# Patient Record
Sex: Male | Born: 1985 | Race: White | Hispanic: No | Marital: Married | Smoking: Never smoker
Health system: Southern US, Community
[De-identification: ages and names within clinical notes are randomized; demographics above are authoritative.]

## PROBLEM LIST (undated history)

## (undated) HISTORY — PX: MECHANICAL AORTIC VALVE REPLACEMENT: SHX2013

---

## 2021-06-18 ENCOUNTER — Encounter: Payer: Self-pay | Admitting: Emergency Medicine

## 2021-06-18 ENCOUNTER — Other Ambulatory Visit: Payer: Self-pay

## 2021-06-18 ENCOUNTER — Emergency Department: Payer: No Typology Code available for payment source

## 2021-06-18 ENCOUNTER — Emergency Department
Admission: EM | Admit: 2021-06-18 | Discharge: 2021-06-18 | Disposition: A | Discharge status: Home / Self Care | Payer: No Typology Code available for payment source | Attending: Emergency Medicine | Admitting: Emergency Medicine

## 2021-06-18 DIAGNOSIS — W500XXA Accidental hit or strike by another person, initial encounter: Secondary | ICD-10-CM | POA: Diagnosis not present

## 2021-06-18 DIAGNOSIS — S0285XA Fracture of orbit, unspecified, initial encounter for closed fracture: Secondary | ICD-10-CM

## 2021-06-18 DIAGNOSIS — S0232XA Fracture of orbital floor, left side, initial encounter for closed fracture: Secondary | ICD-10-CM | POA: Diagnosis not present

## 2021-06-18 DIAGNOSIS — S0592XA Unspecified injury of left eye and orbit, initial encounter: Secondary | ICD-10-CM | POA: Diagnosis present

## 2021-06-18 DIAGNOSIS — Y9371 Activity, boxing: Secondary | ICD-10-CM | POA: Insufficient documentation

## 2021-06-18 MED ORDER — HYDROCODONE-ACETAMINOPHEN 5-325 MG PO TABS: 1.0000 | ORAL_TABLET | ORAL | 0 refills | Status: AC | PRN

## 2021-06-18 MED ORDER — FLUTICASONE PROPIONATE 50 MCG/ACT NA SUSP: 1.0000 | Freq: Two times a day (BID) | NASAL | 0 refills | Status: AC

## 2021-06-18 MED ORDER — BUTALBITAL-APAP-CAFFEINE 50-325-40 MG PO TABS
1.0000 | ORAL_TABLET | Freq: Once | ORAL | Status: DC
Start: 2021-06-18 — End: 2021-06-18
  Filled 2021-06-18: qty 1

## 2021-06-18 MED ORDER — CETIRIZINE HCL 10 MG PO TABS: 10.0000 mg | ORAL_TABLET | Freq: Every day | ORAL | 0 refills | Status: AC

## 2021-06-18 NOTE — ED Triage Notes (Addendum)
First Nurse Note; Pt via POV from home. Pt c/o L eye swelling and pain. States that he was boxing and got hit in the eye PTA. L eye swollen shut at this time. Pt is able to open it but states it is painful and certain eye movements are painful. Pt is A&Ox4 and NAD

## 2021-06-18 NOTE — ED Provider Notes (Signed)
[facility] Provider Note  Patient Contact: 3:21 PM (approximate)   History   Eye Injury   HPI  Keith Roy is a 36 y.o. male who presents the emergency department for evaluation of facial injury.  Patient states that he was boxing earlier today, he was struck in the left eye.  He states that initially he had a little bit of blurred vision but that was clearing up.  No loss of vision, curtain vision was described.  He states that after he was done sparring, he was in his truck, went to blow his nose and as he blew his nose he felt a pop and immediately the left side of his face started to swell.  He did have a little bit of congealed blood come out of his nose when he blew his nose but he has not had any frank epistaxis.  Patient states that his major concern is the amount of edema that developed after blowing his nose.  No periorbital or facial edema prior to blowing his nose.  Patient states that he was able to push on it and make some of the edema go down then had to blow his nose again and it "swelled back up."  Patient denies any headache, neck pain.  No other injury or complaint.  Does not wear glasses or contacts.     Physical Exam   Triage Vital Signs: ED Triage Vitals [06/18/21 1514]  Enc Vitals Group     BP (!) 135/93     Pulse Rate 78     Resp 17     Temp 97.7 F (36.5 C)     Temp Source Oral     SpO2 98 %     Weight 225 lb (102.1 kg)     Height 5' 9.5" (1.765 m)     Head Circumference      Peak Flow      Pain Score 8     Pain Loc      Pain Edu?      Excl. in [place]?     Most recent vital signs: Vitals:   06/18/21 1514 06/18/21 1738  BP: (!) 135/93 129/81  Pulse: 78 81  Resp: 17 14  Temp: 97.7 F (36.5 C) 98 F (36.7 C)  SpO2: 98% 100%     General: Alert and in no acute distress. Eyes:  PERRL. EOMI. large amount of periorbital edema to the left side.  This appears traumatic in nature no evidence of cellulitic changes.  Eyelids are  opened manually and funduscopic exam is performed.  No areas of retained foreign body identified, pupil responds appropriately to light.  Patient has no evidence of hyphema or retinal detachment on funduscopic exam. Head: Left-sided peri and facial edema.  Palpation reveals findings consistent with a  rice crispy sensation inferior orbital region.  No open wounds.  No cellulitic changes.  Patient is tender and along the inferior left orbit extending towards the maxillary sinuses and left nasal bone. What appears to be subcutaneous emphysema, no palpable abnormality.  As well signs of trauma to the skull.  No battle signs, raccoon eyes, serosanguineous fluid drainage from the ears or nares. ENT:      Ears:       Nose: No congestion/rhinnorhea.  No evidence of epistaxis      Mouth/Throat: Mucous membranes are moist. Neck: No stridor. No cervical spine tenderness to palpation.  Cardiovascular:  Good peripheral perfusion Respiratory: Normal respiratory effort without tachypnea  or retractions. Lungs CTAB. Musculoskeletal: Full range of motion to all extremities.  Neurologic:  No gross focal neurologic deficits are appreciated.  Cranial nerves grossly intact Skin:   No rash noted Other:   ED Results / Procedures / Treatments   Labs (all labs ordered are listed, but only abnormal results are displayed) Labs Reviewed - No data to display   EKG     RADIOLOGY  I personally viewed, evaluated, and interpreted these images as part of my medical decision making, as well as reviewing the written report by the radiologist.  ED Provider Interpretation: Patient with findings consistent with a blowout fracture to the left orbit with significant amount of subcutaneous emphysema.  Trace amount of blood identified in the sinuses and periorbital region.  CT Head Wo Contrast  Result Date: 06/18/2021 CLINICAL DATA:  Trauma EXAM: CT HEAD WITHOUT CONTRAST TECHNIQUE: Contiguous axial images were obtained  from the base of the skull through the vertex without intravenous contrast. RADIATION DOSE REDUCTION: This exam was performed according to the departmental dose-optimization program which includes automated exposure control, adjustment of the mA and/or kV according to patient size and/or use of iterative reconstruction technique. COMPARISON:  None Available. FINDINGS: Brain: No acute intracranial findings are seen in noncontrast CT brain. There are no signs of bleeding. Ventricles are not dilated. Vascular: Unremarkable. Skull: No fracture is seen in the calvarium. Sinuses/Orbits: Small air-fluid level is seen in the left maxillary sinus. There is mucosal thickening in the ethmoid, sphenoid and maxillary sinuses. There is possible blowout fracture in the floor of left orbit. Orbits will be better evaluated in the images of the facial bones. Other: None. IMPRESSION: No acute intracranial findings are seen in noncontrast CT brain. Chronic sinusitis. Blowout fracture is seen in the floor of left orbit. Electronically Signed   By: Elmer Picker M.D.   On: 06/18/2021 16:07   CT Cervical Spine Wo Contrast  Result Date: 06/18/2021 CLINICAL DATA:  Trauma EXAM: CT CERVICAL SPINE WITHOUT CONTRAST TECHNIQUE: Multidetector CT imaging of the cervical spine was performed without intravenous contrast. Multiplanar CT image reconstructions were also generated. RADIATION DOSE REDUCTION: This exam was performed according to the departmental dose-optimization program which includes automated exposure control, adjustment of the mA and/or kV according to patient size and/or use of iterative reconstruction technique. COMPARISON:  None Available. FINDINGS: Alignment: Alignment of posterior margins of vertebral bodies is unremarkable. Skull base and vertebrae: No recent fracture is seen. Degenerative changes are noted with bony spurs from C3 to C7 levels. There is calcification in the posterior margin of disc space at C2-C3 level.  Soft tissues and spinal canal: There is extrinsic pressure over the ventral margin of thecal sac caused by posterior bony spurs at C5-C6 and C6-C7 levels. Disc levels: There is encroachment of neural foramina from C3-C7 levels. Upper chest: Unremarkable. Other: None. IMPRESSION: No recent fracture is seen in the cervical spine. Cervical spondylosis with encroachment of neural foramina from C3-C7 levels. Electronically Signed   By: Elmer Picker M.D.   On: 06/18/2021 16:19   CT Maxillofacial Wo Contrast  Result Date: 06/18/2021 CLINICAL DATA:  Trauma EXAM: CT MAXILLOFACIAL WITHOUT CONTRAST TECHNIQUE: Multidetector CT imaging of the maxillofacial structures was performed. Multiplanar CT image reconstructions were also generated. RADIATION DOSE REDUCTION: This exam was performed according to the departmental dose-optimization program which includes automated exposure control, adjustment of the mA and/or kV according to patient size and/or use of iterative reconstruction technique. COMPARISON:  None Available. FINDINGS: Osseous: There  is comminuted blowout fracture in the floor of left orbit. There is a proximally 7 mm depression of fracture fragments. There is acute right angulation in the base of bony portion of nasal septum which may be due to recent or remote injury. Orbits: There is no definite herniation of ocular muscles through the blowout fracture in the floor of the left orbit. Optic globes are symmetrical. There are pockets of air around the optic globe. Retrobulbar soft tissues are essentially unremarkable. Sinuses: There is mucosal thickening and mucous retention cysts in the ethmoid sphenoid and maxillary sinuses. There is minimal amount of fluid in the dependent portion of left maxillary sinus. There are pockets of air in the left periorbital region and left side of face. Soft tissues: There is left periorbital soft tissue swelling and loculated pockets of air related to blowout fracture in the  floor of the left orbit. Limited intracranial: Unremarkable. IMPRESSION: There is comminuted, depressed fracture in the floor of the left orbit. There is no demonstrable herniation of extraocular muscles. Are multiple pockets of air around the optic globe and left periorbital region related to the blowout fracture of the floor of the left orbit. Optic globes are symmetrical. Retrobulbar soft tissues are unremarkable. Chronic sinusitis. Small air-fluid level in the dependent portion of left maxillary sinus may be related to blowout fracture of the floor of the left orbit. Electronically Signed   By: Elmer Picker M.D.   On: 06/18/2021 16:16    PROCEDURES:  Critical Care performed: No  Procedures   MEDICATIONS ORDERED IN ED: Medications  butalbital-acetaminophen-caffeine (FIORICET) 50-325-40 MG per tablet 1 tablet (1 tablet Oral Given 06/18/21 1738)     IMPRESSION / MDM / ASSESSMENT AND PLAN / ED COURSE  I reviewed the triage vital signs and the nursing notes.                              Differential diagnosis includes, but is not limited to, hematoma of the left eye, globe rupture, hyphema, facial fracture, orbital fracture, orbital nerve entrapment, subcutaneous emphysema.   Patient's diagnosis is consistent with orbital blowout fracture with subcutaneous emphysema.  Patient presents to the ED after being hit in the left eye while boxing.  Patient states that his sparring partner hit him directly over the left eye.  States that the blood was not that severe and while he had some blurred vision initially this was improving.  Patient states that he blew his nose when he got back out into his truck and immediate edema of the left portion of his face and the periorbital region.  Physical exam it was concerning that patient had subcutaneous emphysema in the left facial and left periorbital region.  Patient had CT scans which revealed blowout fracture with no evidence of nerve or muscle  entrapment.  He does have a large amount of free air in the soft tissues of the left face and periorbital region.  I reached out to on-call ENT, Dr. Sheppard Coil who advises to keep the patient from performing any heavy lifting, and to try to limit any sneezing, coughing and to not blow his nose.  Patient will have allergy medications to help as he does state that he has some allergic rhinitis.  Pain medication will be prescribed for the patient as well.  Follow-up with ENT.  Concerning signs and symptoms are discussed with the patient and his wife to return to the ED for any  of these signs or symptoms.  Otherwise follow-up with ENT..  Patient is given ED precautions to return to the ED for any worsening or new symptoms.        FINAL CLINICAL IMPRESSION(S) / ED DIAGNOSES   Final diagnoses:  Closed fracture of orbit, initial encounter ([place])     Rx / DC Orders   ED Discharge Orders          Ordered    fluticasone (FLONASE) 50 MCG/ACT nasal spray  2 times daily        06/18/21 1734    cetirizine (ZYRTEC) 10 MG tablet  Daily        06/18/21 1734    HYDROcodone-acetaminophen (NORCO/VICODIN) 5-325 MG tablet  Every 4 hours PRN        06/18/21 1734             Note:  This document was prepared using Dragon voice recognition software and may include unintentional dictation errors.   Brynda Peon 06/18/21 1819    Harvest Dark, MD 06/18/21 Lurline Hare

## 2021-06-18 NOTE — ED Triage Notes (Signed)
Patient to ER via POV from home. Reports fighting today, was struck in the face on his left eye while boxing. Eye swollen. Patient reports swelling started after blowing his nose, some blood was noted on the tissue. States that he is able to open his eye a little, able to move eye freely. Some blurred vision, describes it as "distorted".

## 2021-06-22 ENCOUNTER — Other Ambulatory Visit: Payer: Self-pay

## 2021-06-22 ENCOUNTER — Encounter: Payer: Self-pay | Admitting: *Deleted

## 2021-06-22 ENCOUNTER — Emergency Department
Admission: EM | Admit: 2021-06-22 | Discharge: 2021-06-22 | Discharge status: Against Medical Advice | Payer: No Typology Code available for payment source | Attending: Emergency Medicine | Admitting: Emergency Medicine

## 2021-06-22 DIAGNOSIS — Z5321 Procedure and treatment not carried out due to patient leaving prior to being seen by health care provider: Secondary | ICD-10-CM | POA: Insufficient documentation

## 2021-06-22 DIAGNOSIS — R519 Headache, unspecified: Secondary | ICD-10-CM | POA: Diagnosis present

## 2021-06-22 DIAGNOSIS — H5712 Ocular pain, left eye: Secondary | ICD-10-CM | POA: Insufficient documentation

## 2021-06-22 NOTE — ED Triage Notes (Signed)
Pt has left eye pain and left side face pain.  Pt has orbit fx from boxing 6 days ago and was seen here..  Pt has redness to left eye since yesterday.  Pt has a headache.  Pt alert  speech clear.

## 2021-06-22 NOTE — ED Notes (Signed)
No ct scan at this time per dr Archie Balboa

## 2021-10-24 ENCOUNTER — Encounter: Payer: No Typology Code available for payment source | Attending: Internal Medicine | Admitting: *Deleted

## 2021-10-24 DIAGNOSIS — F339 Major depressive disorder, recurrent, unspecified: Secondary | ICD-10-CM | POA: Insufficient documentation

## 2021-10-24 DIAGNOSIS — S46212A Strain of muscle, fascia and tendon of other parts of biceps, left arm, initial encounter: Secondary | ICD-10-CM | POA: Insufficient documentation

## 2021-10-24 DIAGNOSIS — Z952 Presence of prosthetic heart valve: Secondary | ICD-10-CM | POA: Insufficient documentation

## 2021-10-24 DIAGNOSIS — Z48812 Encounter for surgical aftercare following surgery on the circulatory system: Secondary | ICD-10-CM | POA: Insufficient documentation

## 2021-10-24 DIAGNOSIS — M67919 Unspecified disorder of synovium and tendon, unspecified shoulder: Secondary | ICD-10-CM | POA: Insufficient documentation

## 2021-10-24 DIAGNOSIS — M545 Low back pain, unspecified: Secondary | ICD-10-CM | POA: Insufficient documentation

## 2021-10-24 DIAGNOSIS — Z9889 Other specified postprocedural states: Secondary | ICD-10-CM

## 2021-10-24 DIAGNOSIS — D225 Melanocytic nevi of trunk: Secondary | ICD-10-CM | POA: Insufficient documentation

## 2021-10-24 DIAGNOSIS — I712 Thoracic aortic aneurysm, without rupture, unspecified: Secondary | ICD-10-CM | POA: Insufficient documentation

## 2021-10-24 DIAGNOSIS — S0232XA Fracture of orbital floor, left side, initial encounter for closed fracture: Secondary | ICD-10-CM | POA: Insufficient documentation

## 2021-10-24 NOTE — Progress Notes (Signed)
Initial phone call completed. Diagnosis [facility] be found [facility]. EP Orientation scheduled for Monday 10/9 at 10am.

## 2021-10-31 ENCOUNTER — Encounter: Payer: No Typology Code available for payment source | Admitting: *Deleted

## 2021-10-31 VITALS — Ht 70.0 in | Wt 227.7 lb

## 2021-10-31 DIAGNOSIS — Z48812 Encounter for surgical aftercare following surgery on the circulatory system: Secondary | ICD-10-CM | POA: Diagnosis not present

## 2021-10-31 DIAGNOSIS — Z952 Presence of prosthetic heart valve: Secondary | ICD-10-CM

## 2021-10-31 DIAGNOSIS — Z9889 Other specified postprocedural states: Secondary | ICD-10-CM

## 2021-10-31 NOTE — Patient Instructions (Addendum)
Patient Instructions  Patient Details  Name: Keith Roy MRN: [id number] Date of Birth: Feb 12, 1985 Referring Provider:  Derrill Kay, MD  Below are your personal goals for exercise, nutrition, and risk factors. Our goal is to help you stay on track towards obtaining and maintaining these goals. We will be discussing your progress on these goals with you throughout the program.  Initial Exercise Prescription:  Initial Exercise Prescription - 10/31/21 1500       Date of Initial Exercise RX and Referring Provider   Date 10/31/21    Referring Provider [name]      Oxygen   Maintain Oxygen Saturation 88% or higher      Treadmill   MPH 3    Grade 2    Minutes 15    METs 4.12      Elliptical   Level 2    Speed 4.9    Minutes 15    METs 4      REL-XR   Level 4    Speed 50    Minutes 15    METs 4      Prescription Details   Frequency (times per week) 3    Duration Progress to 30 minutes of continuous aerobic without signs/symptoms of physical distress      Intensity   THRR 40-80% of Max Heartrate 122-162    Ratings of Perceived Exertion 11-13    Perceived Dyspnea 0-4      Progression   Progression Continue to progress workloads to maintain intensity without signs/symptoms of physical distress.      Resistance Training   Training Prescription Yes    Weight 10 lb    Reps 10-15             Exercise Goals: Frequency: Be able to perform aerobic exercise two to three times per week in program working toward 2-5 days per week of home exercise.  Intensity: Work with a perceived exertion of 11 (fairly light) - 15 (hard) while following your exercise prescription.  We will make changes to your prescription with you as you progress through the program.   Duration: Be able to do 30 to 45 minutes of continuous aerobic exercise in addition to a 5 minute warm-up and a 5 minute cool-down routine.   Nutrition Goals: Your personal nutrition goals will be  established when you do your nutrition analysis with the dietician.  The following are general nutrition guidelines to follow: Cholesterol < '200mg'$ /day Sodium < '1500mg'$ /day Fiber: Men under 50 yrs - 38 grams per day  Personal Goals:  Personal Goals and Risk Factors at Admission - 10/31/21 1604       Core Components/Risk Factors/Patient Goals on Admission    Weight Management Yes;Obesity;Weight Loss;Weight Maintenance    Intervention Weight Management: Develop a combined nutrition and exercise program designed to reach desired caloric intake, while maintaining appropriate intake of nutrient and fiber, sodium and fats, and appropriate energy expenditure required for the weight goal.;Weight Management: Provide education and appropriate resources to help participant work on and attain dietary goals.;Weight Management/Obesity: Establish reasonable short term and long term weight goals.;Obesity: Provide education and appropriate resources to help participant work on and attain dietary goals.    Admit Weight 227 lb 11.2 oz (103.3 kg)    Goal Weight: Short Term 225 lb (102.1 kg)    Goal Weight: Long Term 220 lb (99.8 kg)    Expected Outcomes Short Term: Continue to assess and modify interventions until short term weight is  achieved;Long Term: Adherence to nutrition and physical activity/exercise program aimed toward attainment of established weight goal;Weight Loss: Understanding of general recommendations for a balanced deficit meal plan, which promotes 1-2 lb weight loss per week and includes a negative energy balance of [phone] kcal/d;Weight Maintenance: Understanding of the daily nutrition guidelines, which includes 25-35% calories from fat, 7% or less cal from saturated fats, less than '200mg'$  cholesterol, less than 1.5gm of sodium, & 5 or more servings of fruits and vegetables daily;Understanding recommendations for meals to include 15-35% energy as protein, 25-35% energy from fat, 35-60% energy from  carbohydrates, less than '200mg'$  of dietary cholesterol, 20-35 gm of total fiber daily;Understanding of distribution of calorie intake throughout the day with the consumption of 4-5 meals/snacks    Lipids Yes    Intervention Provide education and support for participant on nutrition & aerobic/resistive exercise along with prescribed medications to achieve LDL '70mg'$ , HDL >'40mg'$ .    Expected Outcomes Short Term: Participant states understanding of desired cholesterol values and is compliant with medications prescribed. Participant is following exercise prescription and nutrition guidelines.;Long Term: Cholesterol controlled with medications as prescribed, with individualized exercise RX and with personalized nutrition plan. Value goals: LDL < '70mg'$ , HDL > 40 mg.             Tobacco Use Initial Evaluation: Social History   Tobacco Use  Smoking Status Never  Smokeless Tobacco Never    Exercise Goals and Review:  Exercise Goals     Row Name 10/31/21 1601             Exercise Goals   Increase Physical Activity Yes       Intervention Provide advice, education, support and counseling about physical activity/exercise needs.;Develop an individualized exercise prescription for aerobic and resistive training based on initial evaluation findings, risk stratification, comorbidities and participant's personal goals.       Expected Outcomes Short Term: Attend rehab on a regular basis to increase amount of physical activity.;Long Term: Add in home exercise to make exercise part of routine and to increase amount of physical activity.;Long Term: Exercising regularly at least 3-5 days a week.       Increase Strength and Stamina Yes       Intervention Provide advice, education, support and counseling about physical activity/exercise needs.;Develop an individualized exercise prescription for aerobic and resistive training based on initial evaluation findings, risk stratification, comorbidities and  participant's personal goals.       Expected Outcomes Short Term: Increase workloads from initial exercise prescription for resistance, speed, and METs.;Short Term: Perform resistance training exercises routinely during rehab and add in resistance training at home;Long Term: Improve cardiorespiratory fitness, muscular endurance and strength as measured by increased METs and functional capacity (6MWT)       Able to understand and use rate of perceived exertion (RPE) scale Yes       Intervention Provide education and explanation on how to use RPE scale       Expected Outcomes Short Term: Able to use RPE daily in rehab to express subjective intensity level;Long Term:  Able to use RPE to guide intensity level when exercising independently       Able to understand and use Dyspnea scale Yes       Intervention Provide education and explanation on how to use Dyspnea scale       Expected Outcomes Short Term: Able to use Dyspnea scale daily in rehab to express subjective sense of shortness of breath during exertion;Long Term: Able to  use Dyspnea scale to guide intensity level when exercising independently       Knowledge and understanding of Target Heart Rate Range (THRR) Yes       Intervention Provide education and explanation of THRR including how the numbers were predicted and where they are located for reference       Expected Outcomes Short Term: Able to state/look up THRR;Short Term: Able to use daily as guideline for intensity in rehab;Long Term: Able to use THRR to govern intensity when exercising independently       Able to check pulse independently Yes       Intervention Provide education and demonstration on how to check pulse in carotid and radial arteries.;Review the importance of being able to check your own pulse for safety during independent exercise       Expected Outcomes Short Term: Able to explain why pulse checking is important during independent exercise;Long Term: Able to check pulse  independently and accurately       Understanding of Exercise Prescription Yes       Intervention Provide education, explanation, and written materials on patient's individual exercise prescription       Expected Outcomes Short Term: Able to explain program exercise prescription;Long Term: Able to explain home exercise prescription to exercise independently                Copy of goals given to participant.

## 2021-10-31 NOTE — Progress Notes (Signed)
Cardiac Individual Treatment Plan  Patient Details  Name: Keith Roy MRN: [id number] Date of Birth: January 01, 1986 Referring Provider:   Flowsheet Row Cardiac Rehab from 10/31/2021 in [facility] Cardiac and Pulmonary Rehab  Referring Provider [name]       Initial Encounter Date:  Flowsheet Row Cardiac Rehab from 10/31/2021 in [facility] Cardiac and Pulmonary Rehab  Date 10/31/21       Visit Diagnosis: S/P AVR (aortic valve replacement)  Hx of aortic root repair  Patient's Home Medications on Admission:  Current Outpatient Medications:    acetaminophen (TYLENOL) 325 MG tablet, TAKE THREE TABLETS BY MOUTH EVERY 8 HOURS FOR FEVER EACH TABLET CONTAINS 325MG ACETAMINOPHEN (TYLENOL,APAP). MAXIMUM DAILY RECOMMENDED DOSE IS 3000MG, Disp: , Rfl:    aspirin EC 81 MG tablet, TAKE ONE TABLET BY MOUTH EVERY DAY FOR TREATMENT TO PREVENT A HEART ATTACK ASK YOUR DOCTOR HOW LONG YOU SHOULD TAKE THIS MEDICATION; TAKE EACH DOSE WITH A MEAL.  *FOR HEART PROTECTION AFTER SURGERY* ASK YOUR DOCTOR HOW LONG YOU SHOULD TAKE THIS MEDICATION; TAKE EACH DOSE WITH A MEAL.  *FOR HEART PROTECTION AFTER SURGERY*, Disp: , Rfl:    cetirizine (ZYRTEC) 10 MG tablet, Take 1 tablet (10 mg total) by mouth daily., Disp: 30 tablet, Rfl: 0   diclofenac Sodium (VOLTAREN) 1 % GEL, APPLY 4 GRAMS TOPICALLY TWO TIMES A DAY AS NEEDED FOR RIGHT SHOULDER PAIN, Disp: , Rfl:    fluticasone (FLONASE) 50 MCG/ACT nasal spray, Place 1 spray into both nostrils 2 (two) times daily., Disp: 16 g, Rfl: 0   gabapentin (NEURONTIN) 100 MG capsule, TAKE ONE CAPSULE BY MOUTH THREE TIMES A DAY FOR NEUROPATHIC PAIN, Disp: , Rfl:    HYDROcodone-acetaminophen (NORCO/VICODIN) 5-325 MG tablet, Take 1 tablet by mouth every 4 (four) hours as needed for moderate pain., Disp: 16 tablet, Rfl: 0   metoprolol tartrate (LOPRESSOR) 25 MG tablet, TAKE ONE TABLET BY MOUTH TWO TIMES A DAY FOR HEART RATE CONTROL, Disp: , Rfl:    rosuvastatin (CRESTOR) 40 MG tablet,  Take 1 tablet by mouth at bedtime., Disp: , Rfl:   Past Medical History: No past medical history on file.  Tobacco Use: Social History   Tobacco Use  Smoking Status Never  Smokeless Tobacco Never    Labs: Review Flowsheet        No data to display           Exercise Target Goals: Exercise Program Goal: Individual exercise prescription set using results from initial 6 min walk test and THRR while considering  patient's activity barriers and safety.   Exercise Prescription Goal: Initial exercise prescription builds to 30-45 minutes a day of aerobic activity, 2-3 days per week.  Home exercise guidelines will be given to patient during program as part of exercise prescription that the participant will acknowledge.   Education: Aerobic Exercise: - Group verbal and visual presentation on the components of exercise prescription. Introduces F.I.T.T principle from ACSM for exercise prescriptions.  Reviews F.I.T.T. principles of aerobic exercise including progression. Written material given at graduation.   Education: Resistance Exercise: - Group verbal and visual presentation on the components of exercise prescription. Introduces F.I.T.T principle from ACSM for exercise prescriptions  Reviews F.I.T.T. principles of resistance exercise including progression. Written material given at graduation.    Education: Exercise & Equipment Safety: - Individual verbal instruction and demonstration of equipment use and safety with use of the equipment. Flowsheet Row Cardiac Rehab from 10/31/2021 in [facility] Cardiac and Pulmonary Rehab  Date 10/31/21  Educator [facility]  Instruction Review Code 1- Verbalizes Understanding       Education: Exercise Physiology & General Exercise Guidelines: - Group verbal and written instruction with models to review the exercise physiology of the cardiovascular system and associated critical values. Provides general exercise guidelines with specific guidelines to  those with heart or lung disease.    Education: Flexibility, Balance, Mind/Body Relaxation: - Group verbal and visual presentation with interactive activity on the components of exercise prescription. Introduces F.I.T.T principle from ACSM for exercise prescriptions. Reviews F.I.T.T. principles of flexibility and balance exercise training including progression. Also discusses the mind body connection.  Reviews various relaxation techniques to help reduce and manage stress (i.e. Deep breathing, progressive muscle relaxation, and visualization). Balance handout provided to take home. Written material given at graduation.   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 HR 110 bpm     Max Ex. BP 146/74     2 Minute Post BP 122/64              Oxygen Initial Assessment:   Oxygen Re-Evaluation:   Oxygen Discharge (Final Oxygen Re-Evaluation):   Initial Exercise Prescription:  Initial Exercise Prescription - 10/31/21 1500       Date of Initial Exercise RX and Referring Provider   Date 10/31/21    Referring Provider [name]      Oxygen   Maintain Oxygen Saturation 88% or higher      Treadmill   MPH 3     Grade 2    Minutes 15    METs 4.12      Elliptical   Level 2    Speed 4.9    Minutes 15    METs 4      REL-XR   Level 4    Speed 50    Minutes 15    METs 4      Prescription Details   Frequency (times per week) 3    Duration Progress to 30 minutes of continuous aerobic without signs/symptoms of physical distress      Intensity   THRR 40-80% of Max Heartrate 122-162    Ratings of Perceived Exertion 11-13    Perceived Dyspnea 0-4      Progression   Progression Continue to progress workloads to maintain intensity without signs/symptoms of  physical distress.      Resistance Training   Training Prescription Yes    Weight 10 lb    Reps 10-15             Perform Capillary Blood Glucose checks as needed.  Exercise Prescription Changes:   Exercise Prescription Changes     Row Name 10/31/21 1600             Response to Exercise   Blood Pressure (Admit) 126/72       Blood Pressure (Exercise) 146/74       Blood Pressure (Exit) 122/64       Heart Rate (Admit) 82 bpm       Heart Rate (Exercise) 110 bpm       Heart Rate (Exit) 85 bpm       Oxygen Saturation (Admit) 97 %       Oxygen Saturation (Exercise) 97 %       Rating of Perceived Exertion (Exercise) 7       Symptoms none       Comments walk test results                Exercise Comments:   Exercise Goals and Review:   Exercise Goals     Row Name 10/31/21 1601             Exercise Goals   Increase Physical Activity Yes       Intervention Provide advice, education, support and counseling about physical activity/exercise needs.;Develop an individualized exercise prescription for aerobic and resistive training based on initial evaluation findings, risk stratification, comorbidities and participant's personal goals.       Expected Outcomes Short Term: Attend rehab on a regular basis to increase amount of physical activity.;Long Term: Add in home exercise to make exercise part of routine and to  increase amount of physical activity.;Long Term: Exercising regularly at least 3-5 days a week.       Increase Strength and Stamina Yes       Intervention Provide advice, education, support and counseling about physical activity/exercise needs.;Develop an individualized exercise prescription for aerobic and resistive training based on initial evaluation findings, risk stratification, comorbidities and participant's personal goals.       Expected Outcomes Short Term: Increase workloads from initial exercise prescription for resistance, speed, and METs.;Short Term: Perform resistance training exercises routinely during rehab and add in resistance training at home;Long Term: Improve cardiorespiratory fitness, muscular endurance and strength as measured by increased METs and functional capacity (6MWT)       Able to understand and use rate of perceived exertion (RPE) scale Yes       Intervention Provide education and explanation on how to use RPE scale       Expected Outcomes Short Term: Able to use RPE daily in rehab to express subjective intensity level;Long Term:  Able to use RPE to guide intensity level when exercising independently       Able to understand and use Dyspnea scale Yes       Intervention Provide education and explanation on how to use Dyspnea scale       Expected Outcomes Short Term: Able to use Dyspnea scale daily in rehab to express subjective sense of shortness of breath during exertion;Long Term: Able to use Dyspnea scale to guide intensity level when exercising independently       Knowledge and understanding of Target Heart Rate Range (THRR) Yes       Intervention Provide education and  explanation of THRR including how the numbers were predicted and where they are located for reference       Expected Outcomes Short Term: Able to state/look up THRR;Short Term: Able to use daily as guideline for intensity in rehab;Long Term: Able to use THRR to govern intensity when exercising  independently       Able to check pulse independently Yes       Intervention Provide education and demonstration on how to check pulse in carotid and radial arteries.;Review the importance of being able to check your own pulse for safety during independent exercise       Expected Outcomes Short Term: Able to explain why pulse checking is important during independent exercise;Long Term: Able to check pulse independently and accurately       Understanding of Exercise Prescription Yes       Intervention Provide education, explanation, and written materials on patient's individual exercise prescription       Expected Outcomes Short Term: Able to explain program exercise prescription;Long Term: Able to explain home exercise prescription to exercise independently                Exercise Goals Re-Evaluation :   Discharge Exercise Prescription (Final Exercise Prescription Changes):  Exercise Prescription Changes - 10/31/21 1600       Response to Exercise   Blood Pressure (Admit) 126/72    Blood Pressure (Exercise) 146/74    Blood Pressure (Exit) 122/64    Heart Rate (Admit) 82 bpm    Heart Rate (Exercise) 110 bpm    Heart Rate (Exit) 85 bpm    Oxygen Saturation (Admit) 97 %    Oxygen Saturation (Exercise) 97 %    Rating of Perceived Exertion (Exercise) 7    Symptoms none    Comments walk test results             Nutrition:  Target Goals: Understanding of nutrition guidelines, daily intake of sodium '1500mg'$ , cholesterol '200mg'$ , calories 30% from fat and 7% or less from saturated fats, daily to have 5 or more servings of fruits and vegetables.  Education: All About Nutrition: -Group instruction provided by verbal, written material, interactive activities, discussions, models, and posters to present general guidelines for heart healthy nutrition including fat, fiber, MyPlate, the role of sodium in heart healthy nutrition, utilization of the nutrition label, and utilization of this  knowledge for meal planning. Follow up email sent as well. Written material given at graduation.   Biometrics:  Pre Biometrics - 10/31/21 1602       Pre Biometrics   Height '5\' 10"'$  (1.778 m)    Weight 227 lb 11.2 oz (103.3 kg)    Waist Circumference 40.5 inches    Hip Circumference 40 inches    Waist to Hip Ratio 1.01 %    BMI (Calculated) 32.67    Single Leg Stand 30 seconds              Nutrition Therapy Plan and Nutrition Goals:  Nutrition Therapy & Goals - 10/31/21 1207       Nutrition Therapy   Diet Heart healthy, low Na    Drug/Food Interactions Statins/Certain Fruits;Coumadin/Vit K    Protein (specify units) 80-85g    Fiber 32 grams    Whole Grain Foods 3 servings    Saturated Fats 18 max. grams    Fruits and Vegetables 8 servings/day    Sodium 2 grams      Personal Nutrition Goals   Nutrition Goal ST:  Practice MyPlate guidelines LT: include at least 1/2 grains as whole grains, eat at least 5-8 servings of fruits/vegetables per day, limit saturated fat <18g/day    Comments 36 y.o. M admitted to cardiac rehab s/p AVR. PMHx includes depression, HLD. Relevant medications includes hydrocodone, rosuvastatin, warfarin. Keith Roy reports eating a lot of meat, but has cut back on some of his red meat consumption. He has backyard chickens so he eats 2-3 whole eggs per day; discussed other ways to lower cholesterol such as limiting saturated fat and including more soluble fiber - he likes the idea of using oats as a binder for food like meatloaf to improve nutrition quality. He reports during the winter he eats more venison which is lean and lower in saturated fat. He reports enjoying vegetables, but is limited by his warfarin. his healthcare team suggested 1/2 cup of vitamin K foods MWF. Discussed MNT for warfarin is geared towrds consistentcy of vitamin K rich foods and working with healthcare team to manage INR. He reports being told to limit cranberries and cashews as well; while  cashews do have vitmain K, they are lower than 58mg and are considered low vitamin K foods at 9.6 mcg per 1oz (1 serving) and cranberries are no longer limited with warfarin, but can still have added sugar which is limited in a heart healthy diet. Provided vitamin K content of food and warfarin MNT handout from the Academy of [employer] and Dietetics. He enjoys berries, spaghetti squash, zucchini, and kidney beans when he makes chili. He would like to add in more fruit/vegetable servings as his first goal. Discussed general heart healthy eating.      Intervention Plan   Intervention Prescribe, educate and counsel regarding individualized specific dietary modifications aiming towards targeted core components such as weight, hypertension, lipid management, diabetes, heart failure and other comorbidities.;Nutrition handout(s) given to patient.    Expected Outcomes Short Term Goal: Understand basic principles of dietary content, such as calories, fat, sodium, cholesterol and nutrients.;Short Term Goal: A plan has been developed with personal nutrition goals set during dietitian appointment.;Long Term Goal: Adherence to prescribed nutrition plan.             Nutrition Assessments:  MEDIFICTS Score Key: ?70 Need to make dietary changes  40-70 Heart Healthy Diet ? 40 Therapeutic Level Cholesterol Diet  [facility]  Picture Your Plate Total Score on Admission 57      Picture Your Plate Scores: <<20Unhealthy dietary pattern with much room for improvement. 41-50 Dietary pattern unlikely to meet recommendations for good health and room for improvement. 51-60 More healthful dietary pattern, with some room for improvement.  >60 Healthy dietary pattern, although there may be some specific behaviors that could be improved.    Nutrition Goals Re-Evaluation:   Nutrition Goals Discharge (Final Nutrition Goals  Re-Evaluation):   Psychosocial: Target Goals: Acknowledge presence or absence of significant depression and/or stress, maximize coping skills, provide positive support system. Participant is able to verbalize types and ability to use techniques and skills needed for reducing stress and depression.   Education: Stress, Anxiety, and Depression - Group verbal and visual presentation to define topics covered.  Reviews how body is impacted by stress, anxiety, and depression.  Also discusses healthy ways to reduce stress and to treat/manage anxiety and depression.  Written material given at graduation.   Education: Sleep Hygiene -Provides group verbal and written instruction about how sleep can affect your health.  Define sleep hygiene, discuss sleep cycles and impact of sleep habits. Review good sleep hygiene tips.    Initial Review & Psychosocial Screening:  Initial Psych Review & Screening - 10/24/21 1007       Initial Review   Current issues with Current Stress Concerns;History of Depression    Source of Stress Concerns --    Comments unable to continue boxing, friend just passed away in motorcycle accident      [place]? No    Concerns No support system    Comments Per notes has a wife and baby, stated he did not have a support system after the loss of his friend      Barriers   Psychosocial barriers to participate in program The patient should benefit from training in stress management and relaxation.;There are no identifiable barriers or psychosocial needs.      Screening Interventions   Interventions Encouraged to exercise;To provide support and resources with identified psychosocial needs;Provide feedback about the scores to participant    Expected Outcomes Short Term goal: Utilizing psychosocial counselor, staff and physician to assist with identification of specific Stressors or current issues interfering with healing process. Setting desired goal  for each stressor or current issue identified.;Long Term Goal: Stressors or current issues are controlled or eliminated.;Short Term goal: Identification and review with participant of any Quality of Life or Depression concerns found by scoring the questionnaire.;Long Term goal: The participant improves quality of Life and PHQ9 Scores as seen by post scores and/or verbalization of changes             Quality of Life Scores:   Quality of Life - 10/31/21 1603       Quality of Life   Select Quality of Life      Quality of Life Scores   Health/Function Pre 11.67 %    Socioeconomic Pre 20 %    Psych/Spiritual Pre 12 %    Family Pre 12 %    GLOBAL Pre 13.3 %            Scores of 19 and below usually indicate a poorer quality of life in these areas.  A difference of  2-3 points is a clinically meaningful difference.  A difference of 2-3 points in the total score of the Quality of Life Index has been associated with significant improvement in overall quality of life, self-image, physical symptoms, and general health in studies assessing change in quality of life.  PHQ-9: Review Flowsheet       10/31/2021  Depression screen PHQ 2/9  Decreased Interest 2  Down, Depressed, Hopeless 2  PHQ - 2 Score 4  Altered sleeping 3  Tired, decreased energy 2  Change in appetite 2  Feeling bad or failure about yourself  2  Trouble concentrating 1  Moving slowly or fidgety/restless 0  Suicidal thoughts 2  PHQ-9 Score 16  Difficult doing work/chores Very difficult   Interpretation of Total Score  Total Score Depression Severity:  1-4 = Minimal depression, 5-9 = Mild depression, 10-14 = Moderate depression, 15-19 = Moderately severe depression, 20-27 = Severe depression   Psychosocial Evaluation and Intervention:  Psychosocial Evaluation - 10/24/21 1014       Psychosocial Evaluation & Interventions   Interventions Encouraged to exercise with the program and follow exercise  prescription;Stress management education;Relaxation education    Comments Riggins is coming to cardiac rehab 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 He is already back to  work. 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 He was in the middle of training for a competition when he was told he needed surgery. He does not report any chest pain. When asked about his support system, he stated he does not have any after his one friend died in a motorcycle accident one week after his surgery. Per the [place] notes, he has a wife and child but did not mention them at this time nor did he want to go into a lot of detail about his personal life. His goal is to at least get back in the gym and to know what his heart can handle.    Expected Outcomes Short: attend cardiac rehab for education and exercise. Long: develop and maintian positive self care habits.    Continue Psychosocial Services  Follow up required by staff             Psychosocial Re-Evaluation:   Psychosocial Discharge (Final Psychosocial Re-Evaluation):   Vocational Rehabilitation: Provide vocational rehab assistance to qualifying candidates.   Vocational Rehab Evaluation & Intervention:  Vocational Rehab - 10/24/21 1006       Initial Vocational Rehab Evaluation & Intervention   Assessment shows need for Vocational Rehabilitation No             Education: Education Goals: Education classes will be provided on a variety of topics geared toward better understanding of heart health and risk factor modification. Participant will state understanding/return demonstration of topics presented as noted by education test scores.  Learning Barriers/Preferences:  Learning Barriers/Preferences - 10/24/21 1006       Learning Barriers/Preferences   Learning Barriers None    Learning Preferences Individual Instruction             General Cardiac Education Topics:  AED/CPR: - Group verbal and written instruction  with the use of models to demonstrate the basic use of the AED with the basic ABC's of resuscitation.   Anatomy and Cardiac Procedures: - Group verbal and visual presentation and models provide information about basic cardiac anatomy and function. Reviews the testing methods done to diagnose heart disease and the outcomes of the test results. Describes the treatment choices: Medical Management, Angioplasty, or Coronary Bypass Surgery for treating various heart conditions including Myocardial Infarction, Angina, Valve Disease, and Cardiac Arrhythmias.  Written material given at graduation. Flowsheet Row Cardiac Rehab from 10/31/2021 in [facility] Cardiac and Pulmonary Rehab  Education need identified 10/31/21       Medication Safety: - Group verbal and visual instruction to review commonly prescribed medications for heart and lung disease. Reviews the medication, class of the drug, and side effects. Includes the steps to properly store meds and maintain the prescription regimen.  Written material given at graduation.   Intimacy: - Group verbal instruction through game format to discuss how heart and lung disease can affect sexual intimacy. Written material given at graduation..   Know Your Numbers and Heart Failure: - Group verbal and visual instruction to discuss disease risk factors for cardiac and pulmonary disease and treatment options.  Reviews associated critical values for Overweight/Obesity, Hypertension, Cholesterol, and Diabetes.  Discusses basics of heart failure: signs/symptoms and treatments.  Introduces Heart Failure Zone chart for action plan for heart failure.  Written material given at graduation.   Infection Prevention: - Provides verbal and written material to individual with discussion of infection control including proper hand washing and proper equipment cleaning during exercise session. [place] [facility]  from 10/31/2021 in [facility] Cardiac and Pulmonary Rehab  Date  10/31/21  Educator [facility]  Instruction Review Code 1- Verbalizes Understanding       Falls Prevention: - Provides verbal and written material to individual with discussion of falls prevention and safety. Flowsheet Row Cardiac Rehab from 10/31/2021 in [facility] Cardiac and Pulmonary Rehab  Date 10/31/21  Educator [facility]  Instruction Review Code 1- Verbalizes Understanding       Other: -Provides group and verbal instruction on various topics (see comments)   Knowledge Questionnaire Score:  Knowledge Questionnaire Score - 10/31/21 1604       Knowledge Questionnaire Score   Pre Score 24/26             Core Components/Risk Factors/Patient Goals at Admission:  Personal Goals and Risk Factors at Admission - 10/31/21 1604       Core Components/Risk Factors/Patient Goals on Admission    Weight Management Yes;Obesity;Weight Loss;Weight Maintenance    Intervention Weight Management: Develop a combined nutrition and exercise program designed to reach desired caloric intake, while maintaining appropriate intake of nutrient and fiber, sodium and fats, and appropriate energy expenditure required for the weight goal.;Weight Management: Provide education and appropriate resources to help participant work on and attain dietary goals.;Weight Management/Obesity: Establish reasonable short term and long term weight goals.;Obesity: Provide education and appropriate resources to help participant work on and attain dietary goals.    Admit Weight 227 lb 11.2 oz (103.3 kg)    Goal Weight: Short Term 225 lb (102.1 kg)    Goal Weight: Long Term 220 lb (99.8 kg)    Expected Outcomes Short Term: Continue to assess and modify interventions until short term weight is achieved;Long Term: Adherence to nutrition and physical activity/exercise program aimed toward attainment of established weight goal;Weight Loss: Understanding of general recommendations for a balanced deficit meal plan, which promotes 1-2 lb weight loss  per week and includes a negative energy balance of [phone] kcal/d;Weight Maintenance: Understanding of the daily nutrition guidelines, which includes 25-35% calories from fat, 7% or less cal from saturated fats, less than '200mg'$  cholesterol, less than 1.5gm of sodium, & 5 or more servings of fruits and vegetables daily;Understanding recommendations for meals to include 15-35% energy as protein, 25-35% energy from fat, 35-60% energy from carbohydrates, less than '200mg'$  of dietary cholesterol, 20-35 gm of total fiber daily;Understanding of distribution of calorie intake throughout the day with the consumption of 4-5 meals/snacks    Lipids Yes    Intervention Provide education and support for participant on nutrition & aerobic/resistive exercise along with prescribed medications to achieve LDL '70mg'$ , HDL >'40mg'$ .    Expected Outcomes Short Term: Participant states understanding of desired cholesterol values and is compliant with medications prescribed. Participant is following exercise prescription and nutrition guidelines.;Long Term: Cholesterol controlled with medications as prescribed, with individualized exercise RX and with personalized nutrition plan. Value goals: LDL < '70mg'$ , HDL > 40 mg.             Education:Diabetes - Individual verbal and written instruction to review signs/symptoms of diabetes, desired ranges of glucose level fasting, after meals and with exercise. Acknowledge that pre and post exercise glucose checks will be done for 3 sessions at entry of program.   Core Components/Risk Factors/Patient Goals Review:    Core Components/Risk Factors/Patient Goals at Discharge (Final Review):    ITP Comments:  ITP Comments     Row Name 10/24/21 1022 10/31/21 1521         ITP Comments  Initial phone call completed. [facility]. EP Orientation scheduled for Monday 10/9 at 10am. Completed 6MWT and gym orientation. Initial ITP created and sent for review to  Dr. Emily Filbert, Medical Director.               Comments: Initial ITP

## 2021-11-02 ENCOUNTER — Encounter: Payer: No Typology Code available for payment source | Admitting: *Deleted

## 2021-11-02 ENCOUNTER — Ambulatory Visit: Payer: No Typology Code available for payment source

## 2021-11-03 ENCOUNTER — Ambulatory Visit: Payer: No Typology Code available for payment source

## 2021-11-04 ENCOUNTER — Encounter: Payer: No Typology Code available for payment source | Admitting: *Deleted

## 2021-11-04 DIAGNOSIS — Z48812 Encounter for surgical aftercare following surgery on the circulatory system: Secondary | ICD-10-CM | POA: Diagnosis not present

## 2021-11-04 DIAGNOSIS — Z9889 Other specified postprocedural states: Secondary | ICD-10-CM

## 2021-11-04 DIAGNOSIS — Z952 Presence of prosthetic heart valve: Secondary | ICD-10-CM

## 2021-11-04 NOTE — Progress Notes (Signed)
Daily Session Note  Patient Details  Name: Keith Roy MRN: [id number] Date of Birth: [birth year] Referring Provider:   Flowsheet Row Cardiac Rehab from 10/31/2021 in [facility] Cardiac and Pulmonary Rehab  Referring Provider Derrill Kay ([place])       Encounter Date: 11/04/2021  Check In:  Session Check In - 11/04/21 0941       Check-In   Supervising physician immediately available to respond to emergencies See telemetry face sheet for immediately available ER MD    Location [facility]-Cardiac & Pulmonary Rehab    Staff Present Nyoka Cowden, RN, BSN, MA;Susanne Bice, RN, BSN, CCRP;Noah Tickle, BS, Exercise Physiologist    Virtual Visit No    Medication changes reported     No    Fall or balance concerns reported    No    Warm-up and Cool-down Performed on first and last piece of equipment    Resistance Training Performed Yes    VAD Patient? No      Pain Assessment   Currently in Pain? No/denies                Social History   Tobacco Use  Smoking Status Never  Smokeless Tobacco Never    Goals Met:  Independence with exercise equipment Exercise tolerated well No report of concerns or symptoms today  Goals Unmet:  Not Applicable  Comments: Pt able to follow exercise prescription today without complaint.  Will continue to monitor for progression.    Dr. Emily Filbert is Medical Director for [place].  Dr. Ottie Glazier is Medical Director for [facility] Pulmonary Rehabilitation.

## 2021-11-07 ENCOUNTER — Ambulatory Visit: Payer: No Typology Code available for payment source

## 2021-11-07 ENCOUNTER — Encounter: Payer: No Typology Code available for payment source | Admitting: *Deleted

## 2021-11-07 DIAGNOSIS — Z952 Presence of prosthetic heart valve: Secondary | ICD-10-CM

## 2021-11-07 DIAGNOSIS — Z48812 Encounter for surgical aftercare following surgery on the circulatory system: Secondary | ICD-10-CM | POA: Diagnosis not present

## 2021-11-07 DIAGNOSIS — Z9889 Other specified postprocedural states: Secondary | ICD-10-CM

## 2021-11-07 NOTE — Progress Notes (Signed)
Daily Session Note  Patient Details  Name: Keith Roy MRN: [id number] Date of Birth: 05/08/1985 Referring Provider:   Flowsheet Row Cardiac Rehab from 10/31/2021 in [facility] Cardiac and Pulmonary Rehab  Referring Provider Derrill Kay ([place])       Encounter Date: 11/07/2021  Check In:  Session Check In - 11/07/21 0933       Check-In   Supervising physician immediately available to respond to emergencies See telemetry face sheet for immediately available ER MD    Location [facility]-Cardiac & Pulmonary Rehab    Staff Present Antionette Fairy, BS, Exercise Physiologist;Kelly Amedeo Plenty, BS, ACSM CEP, Exercise Physiologist;Yuktha Kerchner Tamala Julian, RN, ADN    Virtual Visit No    Medication changes reported     No    Fall or balance concerns reported    No    Warm-up and Cool-down Performed on first and last piece of equipment    Resistance Training Performed Yes    VAD Patient? No    PAD/SET Patient? No      Pain Assessment   Currently in Pain? No/denies                Social History   Tobacco Use  Smoking Status Never  Smokeless Tobacco Never    Goals Met:  Independence with exercise equipment Exercise tolerated well No report of concerns or symptoms today Strength training completed today  Goals Unmet:  Not Applicable  Comments: Pt able to follow exercise prescription today without complaint.  Will continue to monitor for progression.    Dr. Emily Filbert is Medical Director for [place].  Dr. Ottie Glazier is Medical Director for [facility] Pulmonary Rehabilitation.

## 2021-11-09 ENCOUNTER — Ambulatory Visit: Payer: No Typology Code available for payment source

## 2021-11-09 ENCOUNTER — Encounter: Payer: No Typology Code available for payment source | Admitting: *Deleted

## 2021-11-09 DIAGNOSIS — Z9889 Other specified postprocedural states: Secondary | ICD-10-CM

## 2021-11-09 DIAGNOSIS — Z48812 Encounter for surgical aftercare following surgery on the circulatory system: Secondary | ICD-10-CM | POA: Diagnosis not present

## 2021-11-09 DIAGNOSIS — Z952 Presence of prosthetic heart valve: Secondary | ICD-10-CM

## 2021-11-09 NOTE — Progress Notes (Signed)
Daily Session Note  Patient Details  Name: Keith Roy MRN: [id number] Date of Birth: August 15, 1985 Referring Provider:   Flowsheet Row Cardiac Rehab from 10/31/2021 in [facility] Cardiac and Pulmonary Rehab  Referring Provider Derrill Kay ([place])       Encounter Date: 11/09/2021  Check In:  Session Check In - 11/09/21 0949       Check-In   Supervising physician immediately available to respond to emergencies See telemetry face sheet for immediately available ER MD    Location [facility]-Cardiac & Pulmonary Rehab    Staff Present Antionette Fairy, BS, Exercise Physiologist;Jessica [place], MA, RCEP, CCRP, Mindi Curling, RN, [place]    Virtual Visit No    Medication changes reported     No    Fall or balance concerns reported    No    Warm-up and Cool-down Performed on first and last piece of equipment    Resistance Training Performed Yes    VAD Patient? No    PAD/SET Patient? No      Pain Assessment   Currently in Pain? No/denies                Social History   Tobacco Use  Smoking Status Never  Smokeless Tobacco Never    Goals Met:  Independence with exercise equipment Exercise tolerated well No report of concerns or symptoms today Strength training completed today  Goals Unmet:  Not Applicable  Comments: Pt able to follow exercise prescription today without complaint.  Will continue to monitor for progression.  Reviewed home exercise with pt today.  Pt plans to walk/run and use weights at home for exercise.  Reviewed THR, pulse, RPE, sign and symptoms, pulse oximetery and when to call 911 or MD.  Also discussed weather considerations and indoor options.  Pt voiced understanding.   Dr. Emily Filbert is Medical Director for [place].  Dr. Ottie Glazier is Medical Director for [facility] Pulmonary Rehabilitation.

## 2021-11-10 ENCOUNTER — Ambulatory Visit: Payer: No Typology Code available for payment source

## 2021-11-14 ENCOUNTER — Ambulatory Visit: Payer: No Typology Code available for payment source

## 2021-11-14 ENCOUNTER — Encounter: Payer: No Typology Code available for payment source | Admitting: *Deleted

## 2021-11-16 ENCOUNTER — Encounter: Payer: No Typology Code available for payment source | Admitting: *Deleted

## 2021-11-16 ENCOUNTER — Ambulatory Visit: Payer: No Typology Code available for payment source

## 2021-11-17 ENCOUNTER — Ambulatory Visit: Payer: No Typology Code available for payment source

## 2021-11-21 ENCOUNTER — Ambulatory Visit: Payer: No Typology Code available for payment source

## 2021-11-21 ENCOUNTER — Encounter: Payer: No Typology Code available for payment source | Admitting: *Deleted

## 2021-11-21 DIAGNOSIS — Z952 Presence of prosthetic heart valve: Secondary | ICD-10-CM

## 2021-11-21 DIAGNOSIS — Z9889 Other specified postprocedural states: Secondary | ICD-10-CM

## 2021-11-21 DIAGNOSIS — Z48812 Encounter for surgical aftercare following surgery on the circulatory system: Secondary | ICD-10-CM | POA: Diagnosis not present

## 2021-11-21 NOTE — Progress Notes (Signed)
Daily Session Note  Patient Details  Name: Keith Roy MRN: [id number] Date of Birth: [birth year] Referring Provider:   Flowsheet Row Cardiac Rehab from 10/31/2021 in [facility] Cardiac and Pulmonary Rehab  Referring Provider Derrill Kay ([place])       Encounter Date: 11/21/2021  Check In:  Session Check In - 11/21/21 0932       Check-In   Supervising physician immediately available to respond to emergencies See telemetry face sheet for immediately available ER MD    Location [facility]-Cardiac & Pulmonary Rehab    Staff Present Antionette Fairy, BS, Exercise Physiologist;Kelly Amedeo Plenty, BS, ACSM CEP, Exercise Physiologist;Renn Dirocco Tamala Julian, RN, ADN    Virtual Visit No    Medication changes reported     No    Fall or balance concerns reported    No    Warm-up and Cool-down Performed on first and last piece of equipment    Resistance Training Performed Yes    VAD Patient? No    PAD/SET Patient? No      Pain Assessment   Currently in Pain? No/denies                Social History   Tobacco Use  Smoking Status Never  Smokeless Tobacco Never    Goals Met:  Independence with exercise equipment Exercise tolerated well No report of concerns or symptoms today Strength training completed today  Goals Unmet:  Not Applicable  Comments: Pt able to follow exercise prescription today without complaint.  Will continue to monitor for progression.    Dr. Emily Filbert is Medical Director for [place].  Dr. Ottie Glazier is Medical Director for [facility] Pulmonary Rehabilitation.

## 2021-11-23 ENCOUNTER — Ambulatory Visit: Payer: No Typology Code available for payment source

## 2021-11-23 ENCOUNTER — Encounter: Payer: Self-pay | Admitting: *Deleted

## 2021-11-23 ENCOUNTER — Encounter: Payer: No Typology Code available for payment source | Admitting: *Deleted

## 2021-11-23 DIAGNOSIS — Z9889 Other specified postprocedural states: Secondary | ICD-10-CM

## 2021-11-23 DIAGNOSIS — Z952 Presence of prosthetic heart valve: Secondary | ICD-10-CM

## 2021-11-23 NOTE — Progress Notes (Signed)
Cardiac Individual Treatment Plan  Patient Details  Name: Keith Roy MRN: [id number] Date of Birth: January 01, 1986 Referring Provider:   Flowsheet Row Cardiac Rehab from 10/31/2021 in [facility] Cardiac and Pulmonary Rehab  Referring Provider [name]       Initial Encounter Date:  Flowsheet Row Cardiac Rehab from 10/31/2021 in [facility] Cardiac and Pulmonary Rehab  Date 10/31/21       Visit Diagnosis: S/P AVR (aortic valve replacement)  Hx of aortic root repair  Patient's Home Medications on Admission:  Current Outpatient Medications:    acetaminophen (TYLENOL) 325 MG tablet, TAKE THREE TABLETS BY MOUTH EVERY 8 HOURS FOR FEVER EACH TABLET CONTAINS 325MG ACETAMINOPHEN (TYLENOL,APAP). MAXIMUM DAILY RECOMMENDED DOSE IS 3000MG, Disp: , Rfl:    aspirin EC 81 MG tablet, TAKE ONE TABLET BY MOUTH EVERY DAY FOR TREATMENT TO PREVENT A HEART ATTACK ASK YOUR DOCTOR HOW LONG YOU SHOULD TAKE THIS MEDICATION; TAKE EACH DOSE WITH A MEAL.  *FOR HEART PROTECTION AFTER SURGERY* ASK YOUR DOCTOR HOW LONG YOU SHOULD TAKE THIS MEDICATION; TAKE EACH DOSE WITH A MEAL.  *FOR HEART PROTECTION AFTER SURGERY*, Disp: , Rfl:    cetirizine (ZYRTEC) 10 MG tablet, Take 1 tablet (10 mg total) by mouth daily., Disp: 30 tablet, Rfl: 0   diclofenac Sodium (VOLTAREN) 1 % GEL, APPLY 4 GRAMS TOPICALLY TWO TIMES A DAY AS NEEDED FOR RIGHT SHOULDER PAIN, Disp: , Rfl:    fluticasone (FLONASE) 50 MCG/ACT nasal spray, Place 1 spray into both nostrils 2 (two) times daily., Disp: 16 g, Rfl: 0   gabapentin (NEURONTIN) 100 MG capsule, TAKE ONE CAPSULE BY MOUTH THREE TIMES A DAY FOR NEUROPATHIC PAIN, Disp: , Rfl:    HYDROcodone-acetaminophen (NORCO/VICODIN) 5-325 MG tablet, Take 1 tablet by mouth every 4 (four) hours as needed for moderate pain., Disp: 16 tablet, Rfl: 0   metoprolol tartrate (LOPRESSOR) 25 MG tablet, TAKE ONE TABLET BY MOUTH TWO TIMES A DAY FOR HEART RATE CONTROL, Disp: , Rfl:    rosuvastatin (CRESTOR) 40 MG tablet,  Take 1 tablet by mouth at bedtime., Disp: , Rfl:   Past Medical History: No past medical history on file.  Tobacco Use: Social History   Tobacco Use  Smoking Status Never  Smokeless Tobacco Never    Labs: Review Flowsheet        No data to display           Exercise Target Goals: Exercise Program Goal: Individual exercise prescription set using results from initial 6 min walk test and THRR while considering  patient's activity barriers and safety.   Exercise Prescription Goal: Initial exercise prescription builds to 30-45 minutes a day of aerobic activity, 2-3 days per week.  Home exercise guidelines will be given to patient during program as part of exercise prescription that the participant will acknowledge.   Education: Aerobic Exercise: - Group verbal and visual presentation on the components of exercise prescription. Introduces F.I.T.T principle from ACSM for exercise prescriptions.  Reviews F.I.T.T. principles of aerobic exercise including progression. Written material given at graduation.   Education: Resistance Exercise: - Group verbal and visual presentation on the components of exercise prescription. Introduces F.I.T.T principle from ACSM for exercise prescriptions  Reviews F.I.T.T. principles of resistance exercise including progression. Written material given at graduation.    Education: Exercise & Equipment Safety: - Individual verbal instruction and demonstration of equipment use and safety with use of the equipment. Flowsheet Row Cardiac Rehab from 10/31/2021 in [facility] Cardiac and Pulmonary Rehab  Date 10/31/21  Educator [facility]  Instruction Review Code 1- Verbalizes Understanding       Education: Exercise Physiology & General Exercise Guidelines: - Group verbal and written instruction with models to review the exercise physiology of the cardiovascular system and associated critical values. Provides general exercise guidelines with specific guidelines to  those with heart or lung disease.    Education: Flexibility, Balance, Mind/Body Relaxation: - Group verbal and visual presentation with interactive activity on the components of exercise prescription. Introduces F.I.T.T principle from ACSM for exercise prescriptions. Reviews F.I.T.T. principles of flexibility and balance exercise training including progression. Also discusses the mind body connection.  Reviews various relaxation techniques to help reduce and manage stress (i.e. Deep breathing, progressive muscle relaxation, and visualization). Balance handout provided to take home. Written material given at graduation.   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 HR 110 bpm     Max Ex. BP 146/74     2 Minute Post BP 122/64              Oxygen Initial Assessment:   Oxygen Re-Evaluation:   Oxygen Discharge (Final Oxygen Re-Evaluation):   Initial Exercise Prescription:  Initial Exercise Prescription - 10/31/21 1500       Date of Initial Exercise RX and Referring Provider   Date 10/31/21    Referring Provider [name]      Oxygen   Maintain Oxygen Saturation 88% or higher      Treadmill   MPH 3     Grade 2    Minutes 15    METs 4.12      Elliptical   Level 2    Speed 4.9    Minutes 15    METs 4      REL-XR   Level 4    Speed 50    Minutes 15    METs 4      Prescription Details   Frequency (times per week) 3    Duration Progress to 30 minutes of continuous aerobic without signs/symptoms of physical distress      Intensity   THRR 40-80% of Max Heartrate 122-162    Ratings of Perceived Exertion 11-13    Perceived Dyspnea 0-4      Progression   Progression Continue to progress workloads to maintain intensity without signs/symptoms of  physical distress.      Resistance Training   Training Prescription Yes    Weight 10 lb    Reps 10-15             Perform Capillary Blood Glucose checks as needed.  Exercise Prescription Changes:   Exercise Prescription Changes     Row Name 10/31/21 1600 11/09/21 1000 11/15/21 1500         Response to Exercise   Blood Pressure (Admit) 126/72 -- 126/64     Blood Pressure (Exercise) 146/74 -- 142/82     Blood Pressure (Exit) 122/64 -- 128/70     Heart Rate (Admit) 82 bpm -- 86 bpm     Heart Rate (Exercise) 110 bpm -- 155 bpm     Heart Rate (Exit) 85 bpm -- 117 bpm     Oxygen Saturation (Admit) 97 % -- --     Oxygen Saturation (Exercise) 97 % -- --     Rating of Perceived Exertion (Exercise) 7 -- 14     Symptoms none -- none     Comments walk test results -- 3rd full day of exercise     Duration -- -- Continue with 30 min of aerobic exercise without signs/symptoms of physical distress.     Intensity -- -- THRR unchanged       Progression   Progression -- -- Continue to progress workloads to maintain intensity without signs/symptoms of physical distress.     Average METs -- -- 4.86       Resistance Training   Training Prescription -- -- Yes     Weight -- -- 10 lb     Reps -- -- 10-15       Interval Training   Interval Training -- -- Yes     Equipment -- -- Treadmill     Comments -- -- speed between 2-6 mph        Treadmill   MPH -- -- 3.5  intervals up to 6     Grade -- -- 3.5     Minutes -- -- 15     METs -- -- 5.37       Elliptical   Level -- -- 4     Speed -- -- 5.4     Minutes -- -- 15       REL-XR   Level -- -- 4     Minutes -- -- 15     METs -- -- 4.3       Home Exercise Plan   Plans to continue exercise at -- Home (comment)  walking, running, weights Home (comment)  walking, running, weights     Frequency -- Add 2 additional days to program exercise sessions. Add 2 additional days to program exercise sessions.     Initial Home Exercises Provided -- 11/09/21 11/09/21       Oxygen   Maintain Oxygen Saturation -- -- 88% or higher              Exercise Comments:   Exercise Goals and Review:   Exercise Goals     Row Name 10/31/21 1601             Exercise Goals   Increase Physical Activity Yes       Intervention Provide advice, education, support and counseling about physical activity/exercise needs.;Develop an individualized exercise prescription for aerobic and resistive training based on initial evaluation findings, risk stratification, comorbidities and participant's personal goals.  Expected Outcomes Short Term: Attend rehab on a regular basis to increase amount of physical activity.;Long Term: Add in home exercise to make exercise part of routine and to increase amount of physical activity.;Long Term: Exercising regularly at least 3-5 days a week.       Increase Strength and Stamina Yes       Intervention Provide advice, education, support and counseling about physical activity/exercise needs.;Develop an individualized exercise prescription for aerobic and resistive training based on initial evaluation findings, risk stratification, comorbidities and participant's personal goals.       Expected Outcomes Short Term: Increase workloads from initial exercise prescription for resistance, speed, and METs.;Short Term: Perform resistance training exercises routinely  during rehab and add in resistance training at home;Long Term: Improve cardiorespiratory fitness, muscular endurance and strength as measured by increased METs and functional capacity (6MWT)       Able to understand and use rate of perceived exertion (RPE) scale Yes       Intervention Provide education and explanation on how to use RPE scale       Expected Outcomes Short Term: Able to use RPE daily in rehab to express subjective intensity level;Long Term:  Able to use RPE to guide intensity level when exercising independently       Able to understand and use Dyspnea scale Yes       Intervention Provide education and explanation on how to use Dyspnea scale       Expected Outcomes Short Term: Able to use Dyspnea scale daily in rehab to express subjective sense of shortness of breath during exertion;Long Term: Able to use Dyspnea scale to guide intensity level when exercising independently       Knowledge and understanding of Target Heart Rate Range (THRR) Yes       Intervention Provide education and explanation of THRR including how the numbers were predicted and where they are located for reference       Expected Outcomes Short Term: Able to state/look up THRR;Short Term: Able to use daily as guideline for intensity in rehab;Long Term: Able to use THRR to govern intensity when exercising independently       Able to check pulse independently Yes       Intervention Provide education and demonstration on how to check pulse in carotid and radial arteries.;Review the importance of being able to check your own pulse for safety during independent exercise       Expected Outcomes Short Term: Able to explain why pulse checking is important during independent exercise;Long Term: Able to check pulse independently and accurately       Understanding of Exercise Prescription Yes       Intervention Provide education, explanation, and written materials on patient's individual exercise prescription       Expected  Outcomes Short Term: Able to explain program exercise prescription;Long Term: Able to explain home exercise prescription to exercise independently                Exercise Goals Re-Evaluation :  Exercise Goals Re-Evaluation     [place] Name 11/09/21 1021 11/15/21 1542           Exercise Goal Re-Evaluation   Exercise Goals Review Increase Physical Activity;Able to understand and use rate of perceived exertion (RPE) scale;Knowledge and understanding of Target Heart Rate Range (THRR);Understanding of Exercise Prescription;Able to understand and use Dyspnea scale;Increase Strength and Stamina;Able to check pulse independently Increase Physical Activity;Increase Strength and Stamina;Understanding of Exercise Prescription  Comments Reviewed home exercise with pt today.  Pt plans to walk/run and use weights at home for exercise.  Reviewed THR, pulse, RPE, sign and symptoms, pulse oximetery and when to call 911 or MD.  Also discussed weather considerations and indoor options.  Pt voiced understanding. Treshaun is doing well in rehab for the first couple of sessions he has been here. He has already worked on the elliptical at a speed of 5.4 at level 4. He also is doing intervals on the treadmill, varying his speed between 2 and 6 mph with small incline. He is hitting his THR with each session thus far. We will continue to monitor as he progresses.      Expected Outcomes SHort; Start to add in cardio at home Long: continue to improve stamina Short: Continue to on elliptical and build up workload Long: Continue to increase overall strength and endurance               Discharge Exercise Prescription (Final Exercise Prescription Changes):  Exercise Prescription Changes - 11/15/21 1500       Response to Exercise   Blood Pressure (Admit) 126/64    Blood Pressure (Exercise) 142/82    Blood Pressure (Exit) 128/70    Heart Rate (Admit) 86 bpm    Heart Rate (Exercise) 155 bpm    Heart Rate (Exit) 117  bpm    Rating of Perceived Exertion (Exercise) 14    Symptoms none    Comments 3rd full day of exercise    Duration Continue with 30 min of aerobic exercise without signs/symptoms of physical distress.    Intensity THRR unchanged      Progression   Progression Continue to progress workloads to maintain intensity without signs/symptoms of physical distress.    Average METs 4.86      Resistance Training   Training Prescription Yes    Weight 10 lb    Reps 10-15      Interval Training   Interval Training Yes    Equipment Treadmill    Comments speed between 2-6 mph      Treadmill   MPH 3.5   intervals up to 6   Grade 3.5    Minutes 15    METs 5.37      Elliptical   Level 4    Speed 5.4    Minutes 15      REL-XR   Level 4    Minutes 15    METs 4.3      Home Exercise Plan   Plans to continue exercise at Home (comment)   walking, running, weights   Frequency Add 2 additional days to program exercise sessions.    Initial Home Exercises Provided 11/09/21      Oxygen   Maintain Oxygen Saturation 88% or higher             Nutrition:  Target Goals: Understanding of nutrition guidelines, daily intake of sodium '1500mg'$ , cholesterol '200mg'$ , calories 30% from fat and 7% or less from saturated fats, daily to have 5 or more servings of fruits and vegetables.  Education: All About Nutrition: -Group instruction provided by verbal, written material, interactive activities, discussions, models, and posters to present general guidelines for heart healthy nutrition including fat, fiber, MyPlate, the role of sodium in heart healthy nutrition, utilization of the nutrition label, and utilization of this knowledge for meal planning. Follow up email sent as well. Written material given at graduation.   Biometrics:  Pre Biometrics - 10/31/21 1610  Pre Biometrics   Height '5\' 10"'$  (1.778 m)    Weight 227 lb 11.2 oz (103.3 kg)    Waist Circumference 40.5 inches    Hip  Circumference 40 inches    Waist to Hip Ratio 1.01 %    BMI (Calculated) 32.67    Single Leg Stand 30 seconds              Nutrition Therapy Plan and Nutrition Goals:  Nutrition Therapy & Goals - 10/31/21 1207       Nutrition Therapy   Diet Heart healthy, low Na    Drug/Food Interactions Statins/Certain Fruits;Coumadin/Vit K    Protein (specify units) 80-85g    Fiber 32 grams    Whole Grain Foods 3 servings    Saturated Fats 18 max. grams    Fruits and Vegetables 8 servings/day    Sodium 2 grams      Personal Nutrition Goals   Nutrition Goal ST: Practice MyPlate guidelines LT: include at least 1/2 grains as whole grains, eat at least 5-8 servings of fruits/vegetables per day, limit saturated fat <18g/day    Comments 36 y.o. M admitted to cardiac rehab s/p AVR. PMHx includes depression, HLD. Relevant medications includes hydrocodone, rosuvastatin, warfarin. Kyng reports eating a lot of meat, but has cut back on some of his red meat consumption. He has backyard chickens so he eats 2-3 whole eggs per day; discussed other ways to lower cholesterol such as limiting saturated fat and including more soluble fiber - he likes the idea of using oats as a binder for food like meatloaf to improve nutrition quality. He reports during the winter he eats more venison which is lean and lower in saturated fat. He reports enjoying vegetables, but is limited by his warfarin. his healthcare team suggested 1/2 cup of vitamin K foods MWF. Discussed MNT for warfarin is geared towrds consistentcy of vitamin K rich foods and working with healthcare team to manage INR. He reports being told to limit cranberries and cashews as well; while cashews do have vitmain K, they are lower than 22mg and are considered low vitamin K foods at 9.6 mcg per 1oz (1 serving) and cranberries are no longer limited with warfarin, but can still have added sugar which is limited in a heart healthy diet. Provided vitamin K content of  food and warfarin MNT handout from the Academy of [employer] and Dietetics. He enjoys berries, spaghetti squash, zucchini, and kidney beans when he makes chili. He would like to add in more fruit/vegetable servings as his first goal. Discussed general heart healthy eating.      Intervention Plan   Intervention Prescribe, educate and counsel regarding individualized specific dietary modifications aiming towards targeted core components such as weight, hypertension, lipid management, diabetes, heart failure and other comorbidities.;Nutrition handout(s) given to patient.    Expected Outcomes Short Term Goal: Understand basic principles of dietary content, such as calories, fat, sodium, cholesterol and nutrients.;Short Term Goal: A plan has been developed with personal nutrition goals set during dietitian appointment.;Long Term Goal: Adherence to prescribed nutrition plan.             Nutrition Assessments:  MEDIFICTS Score Key: ?70 Need to make dietary changes  40-70 Heart Healthy Diet ? 40 Therapeutic Level Cholesterol Diet  [facility]  Picture Your Plate Total Score on Admission 57      Picture Your Plate Scores: <<99Unhealthy dietary pattern with much room for  improvement. 41-50 Dietary pattern unlikely to meet recommendations for good health and room for improvement. 51-60 More healthful dietary pattern, with some room for improvement.  >60 Healthy dietary pattern, although there may be some specific behaviors that could be improved.    Nutrition Goals Re-Evaluation:   Nutrition Goals Discharge (Final Nutrition Goals Re-Evaluation):   Psychosocial: Target Goals: Acknowledge presence or absence of significant depression and/or stress, maximize coping skills, provide positive support system. Participant is able to verbalize types and ability to use techniques and skills needed for reducing stress and depression.    Education: Stress, Anxiety, and Depression - Group verbal and visual presentation to define topics covered.  Reviews how body is impacted by stress, anxiety, and depression.  Also discusses healthy ways to reduce stress and to treat/manage anxiety and depression.  Written material given at graduation.   Education: Sleep Hygiene -Provides group verbal and written instruction about how sleep can affect your health.  Define sleep hygiene, discuss sleep cycles and impact of sleep habits. Review good sleep hygiene tips.    Initial Review & Psychosocial Screening:  Initial Psych Review & Screening - 10/24/21 1007       Initial Review   Current issues with Current Stress Concerns;History of Depression    Source of Stress Concerns --    Comments unable to continue boxing, friend just passed away in motorcycle accident      [place]? No    Concerns No support system    Comments Per notes has a wife and baby, stated he did not have a support system after the loss of his friend      Barriers   Psychosocial barriers to participate in program The patient should benefit from training in stress management and relaxation.;There are no identifiable barriers or psychosocial needs.      Screening Interventions   Interventions Encouraged to exercise;To provide support and resources with identified psychosocial needs;Provide feedback about the scores to participant    Expected Outcomes Short Term goal: Utilizing psychosocial counselor, staff and physician to assist with identification of specific Stressors or current issues interfering with healing process. Setting desired goal for each stressor or current issue identified.;Long Term Goal: Stressors or current issues are controlled or eliminated.;Short Term goal: Identification and review with participant of any Quality of Life or Depression concerns found by scoring the questionnaire.;Long Term goal: The participant improves  quality of Life and PHQ9 Scores as seen by post scores and/or verbalization of changes             Quality of Life Scores:   Quality of Life - 10/31/21 1603       Quality of Life   Select Quality of Life      Quality of Life Scores   Health/Function Pre 11.67 %    Socioeconomic Pre 20 %    Psych/Spiritual Pre 12 %    Family Pre 12 %    GLOBAL Pre 13.3 %            Scores of 19 and below usually indicate a poorer quality of life in these areas.  A difference of  2-3 points is a clinically meaningful difference.  A difference of 2-3 points in the total score of the Quality of Life Index has been associated with significant improvement in overall quality of life, self-image, physical symptoms, and general health in studies assessing change in quality of life.  PHQ-9: Review Flowsheet  10/31/2021  Depression screen PHQ 2/9  Decreased Interest 2  Down, Depressed, Hopeless 2  PHQ - 2 Score 4  Altered sleeping 3  Tired, decreased energy 2  Change in appetite 2  Feeling bad or failure about yourself  2  Trouble concentrating 1  Moving slowly or fidgety/restless 0  Suicidal thoughts 2  PHQ-9 Score 16  Difficult doing work/chores Very difficult   Interpretation of Total Score  Total Score Depression Severity:  1-4 = Minimal depression, 5-9 = Mild depression, 10-14 = Moderate depression, 15-19 = Moderately severe depression, 20-27 = Severe depression   Psychosocial Evaluation and Intervention:  Psychosocial Evaluation - 10/24/21 1014       Psychosocial Evaluation & Interventions   Interventions Encouraged to exercise with the program and follow exercise prescription;Stress management education;Relaxation education    Comments Jadon is coming to cardiac rehab 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 He is already back to work. 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 He was in the middle of training for a competition when he was told he needed  surgery. He does not report any chest pain. When asked about his support system, he stated he does not have any after his one friend died in a motorcycle accident one week after his surgery. Per the [place] notes, he has a wife and child but did not mention them at this time nor did he want to go into a lot of detail about his personal life. His goal is to at least get back in the gym and to know what his heart can handle.    Expected Outcomes Short: attend cardiac rehab for education and exercise. Long: develop and maintian positive self care habits.    Continue Psychosocial Services  Follow up required by staff             Psychosocial Re-Evaluation:   Psychosocial Discharge (Final Psychosocial Re-Evaluation):   Vocational Rehabilitation: Provide vocational rehab assistance to qualifying candidates.   Vocational Rehab Evaluation & Intervention:  Vocational Rehab - 10/24/21 1006       Initial Vocational Rehab Evaluation & Intervention   Assessment shows need for Vocational Rehabilitation No             Education: Education Goals: Education classes will be provided on a variety of topics geared toward better understanding of heart health and risk factor modification. Participant will state understanding/return demonstration of topics presented as noted by education test scores.  Learning Barriers/Preferences:  Learning Barriers/Preferences - 10/24/21 1006       Learning Barriers/Preferences   Learning Barriers None    Learning Preferences Individual Instruction             General Cardiac Education Topics:  AED/CPR: - Group verbal and written instruction with the use of models to demonstrate the basic use of the AED with the basic ABC's of resuscitation.   Anatomy and Cardiac Procedures: - Group verbal and visual presentation and models provide information about basic cardiac anatomy and function. Reviews the testing methods done to diagnose heart disease and the  outcomes of the test results. Describes the treatment choices: Medical Management, Angioplasty, or Coronary Bypass Surgery for treating various heart conditions including Myocardial Infarction, Angina, Valve Disease, and Cardiac Arrhythmias.  Written material given at graduation. Flowsheet Row Cardiac Rehab from 10/31/2021 in [facility] Cardiac and Pulmonary Rehab  Education need identified 10/31/21       Medication Safety: - Group verbal and visual  instruction to review commonly prescribed medications for heart and lung disease. Reviews the medication, class of the drug, and side effects. Includes the steps to properly store meds and maintain the prescription regimen.  Written material given at graduation.   Intimacy: - Group verbal instruction through game format to discuss how heart and lung disease can affect sexual intimacy. Written material given at graduation..   Know Your Numbers and Heart Failure: - Group verbal and visual instruction to discuss disease risk factors for cardiac and pulmonary disease and treatment options.  Reviews associated critical values for Overweight/Obesity, Hypertension, Cholesterol, and Diabetes.  Discusses basics of heart failure: signs/symptoms and treatments.  Introduces Heart Failure Zone chart for action plan for heart failure.  Written material given at graduation.   Infection Prevention: - Provides verbal and written material to individual with discussion of infection control including proper hand washing and proper equipment cleaning during exercise session. Flowsheet Row Cardiac Rehab from 10/31/2021 in [facility] Cardiac and Pulmonary Rehab  Date 10/31/21  Educator [facility]  Instruction Review Code 1- Verbalizes Understanding       Falls Prevention: - Provides verbal and written material to individual with discussion of falls prevention and safety. Flowsheet Row Cardiac Rehab from 10/31/2021 in [facility] Cardiac and Pulmonary Rehab  Date 10/31/21  Educator [facility]   Instruction Review Code 1- Verbalizes Understanding       Other: -Provides group and verbal instruction on various topics (see comments)   Knowledge Questionnaire Score:  Knowledge Questionnaire Score - 10/31/21 1604       Knowledge Questionnaire Score   Pre Score 24/26             Core Components/Risk Factors/Patient Goals at Admission:  Personal Goals and Risk Factors at Admission - 10/31/21 1604       Core Components/Risk Factors/Patient Goals on Admission    Weight Management Yes;Obesity;Weight Loss;Weight Maintenance    Intervention Weight Management: Develop a combined nutrition and exercise program designed to reach desired caloric intake, while maintaining appropriate intake of nutrient and fiber, sodium and fats, and appropriate energy expenditure required for the weight goal.;Weight Management: Provide education and appropriate resources to help participant work on and attain dietary goals.;Weight Management/Obesity: Establish reasonable short term and long term weight goals.;Obesity: Provide education and appropriate resources to help participant work on and attain dietary goals.    Admit Weight 227 lb 11.2 oz (103.3 kg)    Goal Weight: Short Term 225 lb (102.1 kg)    Goal Weight: Long Term 220 lb (99.8 kg)    Expected Outcomes Short Term: Continue to assess and modify interventions until short term weight is achieved;Long Term: Adherence to nutrition and physical activity/exercise program aimed toward attainment of established weight goal;Weight Loss: Understanding of general recommendations for a balanced deficit meal plan, which promotes 1-2 lb weight loss per week and includes a negative energy balance of [phone] kcal/d;Weight Maintenance: Understanding of the daily nutrition guidelines, which includes 25-35% calories from fat, 7% or less cal from saturated fats, less than '200mg'$  cholesterol, less than 1.5gm of sodium, & 5 or more servings of fruits and vegetables  daily;Understanding recommendations for meals to include 15-35% energy as protein, 25-35% energy from fat, 35-60% energy from carbohydrates, less than '200mg'$  of dietary cholesterol, 20-35 gm of total fiber daily;Understanding of distribution of calorie intake throughout the day with the consumption of 4-5 meals/snacks    Lipids Yes    Intervention Provide education and support for participant on nutrition & aerobic/resistive exercise  along with prescribed medications to achieve LDL '70mg'$ , HDL >'40mg'$ .    Expected Outcomes Short Term: Participant states understanding of desired cholesterol values and is compliant with medications prescribed. Participant is following exercise prescription and nutrition guidelines.;Long Term: Cholesterol controlled with medications as prescribed, with individualized exercise RX and with personalized nutrition plan. Value goals: LDL < '70mg'$ , HDL > 40 mg.             Education:Diabetes - Individual verbal and written instruction to review signs/symptoms of diabetes, desired ranges of glucose level fasting, after meals and with exercise. Acknowledge that pre and post exercise glucose checks will be done for 3 sessions at entry of program.   Core Components/Risk Factors/Patient Goals Review:    Core Components/Risk Factors/Patient Goals at Discharge (Final Review):    ITP Comments:  ITP Comments     Row Name 10/24/21 1022 10/31/21 1521 11/23/21 1037       ITP Comments Initial phone call completed. Diagnosis can be found in [facility]. EP Orientation scheduled for Monday 10/9 at 10am. Completed 6MWT and gym orientation. Initial ITP created and sent for review to Dr. Emily Filbert, Medical Director. 30 Day review completed. Medical Director ITP review done, changes made as directed, and signed approval by Medical Director.    NEW TO PROGRAM              Comments:

## 2021-11-24 ENCOUNTER — Ambulatory Visit: Payer: No Typology Code available for payment source

## 2021-11-25 ENCOUNTER — Encounter: Payer: No Typology Code available for payment source | Attending: Internal Medicine

## 2021-11-25 DIAGNOSIS — Z9889 Other specified postprocedural states: Secondary | ICD-10-CM | POA: Insufficient documentation

## 2021-11-25 DIAGNOSIS — Z952 Presence of prosthetic heart valve: Secondary | ICD-10-CM | POA: Insufficient documentation

## 2021-11-28 ENCOUNTER — Encounter: Payer: No Typology Code available for payment source | Admitting: *Deleted

## 2021-11-28 ENCOUNTER — Ambulatory Visit: Payer: No Typology Code available for payment source

## 2021-11-30 ENCOUNTER — Encounter: Payer: No Typology Code available for payment source | Admitting: *Deleted

## 2021-11-30 ENCOUNTER — Ambulatory Visit: Payer: No Typology Code available for payment source

## 2021-11-30 DIAGNOSIS — Z952 Presence of prosthetic heart valve: Secondary | ICD-10-CM

## 2021-11-30 DIAGNOSIS — Z9889 Other specified postprocedural states: Secondary | ICD-10-CM

## 2021-11-30 NOTE — Progress Notes (Signed)
Daily Session Note  Patient Details  Name: Keith Roy MRN: [id number] Date of Birth: [birth year] Referring Provider:   Flowsheet Row Cardiac Rehab from 10/31/2021 in [facility] Cardiac and Pulmonary Rehab  Referring Provider Derrill Kay ([place])       Encounter Date: 11/30/2021  Check In:  Session Check In - 11/30/21 0940       Check-In   Supervising physician immediately available to respond to emergencies See telemetry face sheet for immediately available ER MD    Location [facility]-Cardiac & Pulmonary Rehab    Staff Present Antionette Fairy, BS, Exercise Physiologist;Joseph [place]bud Poles, RN, [place]    Virtual Visit No    Medication changes reported     No    Fall or balance concerns reported    No    Warm-up and Cool-down Performed on first and last piece of equipment    Resistance Training Performed Yes    VAD Patient? No    PAD/SET Patient? No      Pain Assessment   Currently in Pain? No/denies                Social History   Tobacco Use  Smoking Status Never  Smokeless Tobacco Never    Goals Met:  Independence with exercise equipment Exercise tolerated well No report of concerns or symptoms today Strength training completed today  Goals Unmet:  Not Applicable  Comments: Pt able to follow exercise prescription today without complaint.  Will continue to monitor for progression.    Dr. Emily Filbert is Medical Director for [place].  Dr. Ottie Glazier is Medical Director for [facility] Pulmonary Rehabilitation.

## 2021-12-01 ENCOUNTER — Ambulatory Visit: Payer: No Typology Code available for payment source

## 2021-12-02 ENCOUNTER — Encounter: Payer: No Typology Code available for payment source | Admitting: *Deleted

## 2021-12-02 DIAGNOSIS — Z9889 Other specified postprocedural states: Secondary | ICD-10-CM

## 2021-12-02 DIAGNOSIS — Z952 Presence of prosthetic heart valve: Secondary | ICD-10-CM

## 2021-12-02 NOTE — Progress Notes (Signed)
Daily Session Note  Patient Details  Name: Keith Roy MRN: [id number] Date of Birth: 08-Jun-1985 Referring Provider:   Flowsheet Row Cardiac Rehab from 10/31/2021 in [facility] Cardiac and Pulmonary Rehab  Referring Provider Derrill Kay ([place])       Encounter Date: 12/02/2021  Check In:  Session Check In - 12/02/21 1042       Check-In   Supervising physician immediately available to respond to emergencies See telemetry face sheet for immediately available ER MD    Location [facility]-Cardiac & Pulmonary Rehab    Staff Present Heath Lark, RN, BSN, CCRP;Jessica [place], MA, RCEP, CCRP, CCET;Joseph [place], [place]    Virtual Visit No    Medication changes reported     No    Fall or balance concerns reported    No    Warm-up and Cool-down Performed on first and last piece of equipment    Resistance Training Performed Yes    VAD Patient? No    PAD/SET Patient? No      Pain Assessment   Currently in Pain? No/denies                Social History   Tobacco Use  Smoking Status Never  Smokeless Tobacco Never    Goals Met:  Independence with exercise equipment Exercise tolerated well No report of concerns or symptoms today  Goals Unmet:  Not Applicable  Comments: Pt able to follow exercise prescription today without complaint.  Will continue to monitor for progression.    Dr. Emily Filbert is Medical Director for [place].  Dr. Ottie Glazier is Medical Director for [facility] Pulmonary Rehabilitation.

## 2021-12-05 ENCOUNTER — Ambulatory Visit: Payer: No Typology Code available for payment source

## 2021-12-05 ENCOUNTER — Encounter: Payer: No Typology Code available for payment source | Admitting: *Deleted

## 2021-12-07 ENCOUNTER — Encounter: Payer: No Typology Code available for payment source | Admitting: *Deleted

## 2021-12-07 ENCOUNTER — Ambulatory Visit: Payer: No Typology Code available for payment source

## 2021-12-08 ENCOUNTER — Ambulatory Visit: Payer: No Typology Code available for payment source

## 2021-12-09 ENCOUNTER — Encounter: Payer: No Typology Code available for payment source | Admitting: *Deleted

## 2021-12-09 DIAGNOSIS — Z952 Presence of prosthetic heart valve: Secondary | ICD-10-CM | POA: Diagnosis not present

## 2021-12-09 DIAGNOSIS — Z9889 Other specified postprocedural states: Secondary | ICD-10-CM

## 2021-12-09 NOTE — Progress Notes (Signed)
Daily Session Note  Patient Details  Name: Keith Roy MRN: [id number] Date of Birth: 1985-09-27 Referring Provider:   Flowsheet Row Cardiac Rehab from 10/31/2021 in [facility] Cardiac and Pulmonary Rehab  Referring Provider Derrill Kay ([place])       Encounter Date: 12/09/2021  Check In:  Session Check In - 12/09/21 0945       Check-In   Supervising physician immediately available to respond to emergencies See telemetry face sheet for immediately available ER MD    Location [facility]-Cardiac & Pulmonary Rehab    Staff Present Alberteen Sam, MA, RCEP, CCRP, CCET;Joseph [place], [place], RN, [place]    Virtual Visit No    Medication changes reported     No    Fall or balance concerns reported    No    Warm-up and Cool-down Performed on first and last piece of equipment    Resistance Training Performed Yes    VAD Patient? No    PAD/SET Patient? No      Pain Assessment   Currently in Pain? No/denies                Social History   Tobacco Use  Smoking Status Never  Smokeless Tobacco Never    Goals Met:  Independence with exercise equipment Exercise tolerated well No report of concerns or symptoms today Strength training completed today  Goals Unmet:  Not Applicable  Comments: Pt able to follow exercise prescription today without complaint.  Will continue to monitor for progression.    Dr. Emily Filbert is Medical Director for [place].  Dr. Ottie Glazier is Medical Director for [facility] Pulmonary Rehabilitation.

## 2021-12-12 ENCOUNTER — Ambulatory Visit: Payer: No Typology Code available for payment source

## 2021-12-12 ENCOUNTER — Encounter: Payer: No Typology Code available for payment source | Admitting: *Deleted

## 2021-12-14 ENCOUNTER — Ambulatory Visit: Payer: No Typology Code available for payment source

## 2021-12-14 ENCOUNTER — Encounter: Payer: No Typology Code available for payment source | Admitting: *Deleted

## 2021-12-19 ENCOUNTER — Ambulatory Visit: Payer: No Typology Code available for payment source

## 2021-12-19 ENCOUNTER — Encounter: Payer: No Typology Code available for payment source | Admitting: *Deleted

## 2021-12-21 ENCOUNTER — Encounter: Payer: No Typology Code available for payment source | Admitting: *Deleted

## 2021-12-21 ENCOUNTER — Ambulatory Visit: Payer: No Typology Code available for payment source

## 2021-12-21 ENCOUNTER — Encounter: Payer: Self-pay | Admitting: *Deleted

## 2021-12-21 DIAGNOSIS — Z952 Presence of prosthetic heart valve: Secondary | ICD-10-CM | POA: Diagnosis not present

## 2021-12-21 DIAGNOSIS — Z9889 Other specified postprocedural states: Secondary | ICD-10-CM

## 2021-12-21 NOTE — Progress Notes (Signed)
Daily Session Note  Patient Details  Name: Keith Roy MRN: [id number] Date of Birth: 14-Dec-1985 Referring Provider:   Flowsheet Row Cardiac Rehab from 10/31/2021 in [facility] Cardiac and Pulmonary Rehab  Referring Provider Derrill Kay ([place])       Encounter Date: 12/21/2021  Check In:  Session Check In - 12/21/21 0928       Check-In   Supervising physician immediately available to respond to emergencies See telemetry face sheet for immediately available ER MD    Location [facility]-Cardiac & Pulmonary Rehab    Staff Present Antionette Fairy, BS, Exercise Physiologist;Jessica [place], MA, RCEP, CCRP, Mindi [place], RN, [place]    Virtual Visit No    Medication changes reported     No    Fall or balance concerns reported    No    Warm-up and Cool-down Performed on first and last piece of equipment    Resistance Training Performed Yes    VAD Patient? No    PAD/SET Patient? No      Pain Assessment   Currently in Pain? No/denies                Social History   Tobacco Use  Smoking Status Never  Smokeless Tobacco Never    Goals Met:  Independence with exercise equipment Exercise tolerated well No report of concerns or symptoms today Strength training completed today  Goals Unmet:  Not Applicable  Comments:  Keith Roy graduated today from  rehab with 9 sessions completed.  Details of the patient's exercise prescription and what He needs to do in order to continue the prescription and progress were discussed with patient.  Patient was given a copy of prescription and goals.  Patient verbalized understanding.  Keith Roy plans to continue to exercise by walking, running, weights.    Dr. Emily Filbert is Medical Director for [place].  Dr. Ottie Glazier is Medical Director for [facility] Pulmonary Rehabilitation.

## 2021-12-21 NOTE — Progress Notes (Signed)
Cardiac Individual Treatment Plan  Patient Details  Name: Keith Roy MRN: [id number] Date of Birth: January 01, 1986 Referring Provider:   Flowsheet Row Cardiac Rehab from 10/31/2021 in [facility] Cardiac and Pulmonary Rehab  Referring Provider [name]       Initial Encounter Date:  Flowsheet Row Cardiac Rehab from 10/31/2021 in [facility] Cardiac and Pulmonary Rehab  Date 10/31/21       Visit Diagnosis: S/P AVR (aortic valve replacement)  Hx of aortic root repair  Patient's Home Medications on Admission:  Current Outpatient Medications:    acetaminophen (TYLENOL) 325 MG tablet, TAKE THREE TABLETS BY MOUTH EVERY 8 HOURS FOR FEVER EACH TABLET CONTAINS 325MG ACETAMINOPHEN (TYLENOL,APAP). MAXIMUM DAILY RECOMMENDED DOSE IS 3000MG, Disp: , Rfl:    aspirin EC 81 MG tablet, TAKE ONE TABLET BY MOUTH EVERY DAY FOR TREATMENT TO PREVENT A HEART ATTACK ASK YOUR DOCTOR HOW LONG YOU SHOULD TAKE THIS MEDICATION; TAKE EACH DOSE WITH A MEAL.  *FOR HEART PROTECTION AFTER SURGERY* ASK YOUR DOCTOR HOW LONG YOU SHOULD TAKE THIS MEDICATION; TAKE EACH DOSE WITH A MEAL.  *FOR HEART PROTECTION AFTER SURGERY*, Disp: , Rfl:    cetirizine (ZYRTEC) 10 MG tablet, Take 1 tablet (10 mg total) by mouth daily., Disp: 30 tablet, Rfl: 0   diclofenac Sodium (VOLTAREN) 1 % GEL, APPLY 4 GRAMS TOPICALLY TWO TIMES A DAY AS NEEDED FOR RIGHT SHOULDER PAIN, Disp: , Rfl:    fluticasone (FLONASE) 50 MCG/ACT nasal spray, Place 1 spray into both nostrils 2 (two) times daily., Disp: 16 g, Rfl: 0   gabapentin (NEURONTIN) 100 MG capsule, TAKE ONE CAPSULE BY MOUTH THREE TIMES A DAY FOR NEUROPATHIC PAIN, Disp: , Rfl:    HYDROcodone-acetaminophen (NORCO/VICODIN) 5-325 MG tablet, Take 1 tablet by mouth every 4 (four) hours as needed for moderate pain., Disp: 16 tablet, Rfl: 0   metoprolol tartrate (LOPRESSOR) 25 MG tablet, TAKE ONE TABLET BY MOUTH TWO TIMES A DAY FOR HEART RATE CONTROL, Disp: , Rfl:    rosuvastatin (CRESTOR) 40 MG tablet,  Take 1 tablet by mouth at bedtime., Disp: , Rfl:   Past Medical History: No past medical history on file.  Tobacco Use: Social History   Tobacco Use  Smoking Status Never  Smokeless Tobacco Never    Labs: Review Flowsheet        No data to display           Exercise Target Goals: Exercise Program Goal: Individual exercise prescription set using results from initial 6 min walk test and THRR while considering  patient's activity barriers and safety.   Exercise Prescription Goal: Initial exercise prescription builds to 30-45 minutes a day of aerobic activity, 2-3 days per week.  Home exercise guidelines will be given to patient during program as part of exercise prescription that the participant will acknowledge.   Education: Aerobic Exercise: - Group verbal and visual presentation on the components of exercise prescription. Introduces F.I.T.T principle from ACSM for exercise prescriptions.  Reviews F.I.T.T. principles of aerobic exercise including progression. Written material given at graduation.   Education: Resistance Exercise: - Group verbal and visual presentation on the components of exercise prescription. Introduces F.I.T.T principle from ACSM for exercise prescriptions  Reviews F.I.T.T. principles of resistance exercise including progression. Written material given at graduation.    Education: Exercise & Equipment Safety: - Individual verbal instruction and demonstration of equipment use and safety with use of the equipment. Flowsheet Row Cardiac Rehab from 10/31/2021 in [facility] Cardiac and Pulmonary Rehab  Date 10/31/21  Educator [facility]  Instruction Review Code 1- Verbalizes Understanding       Education: Exercise Physiology & General Exercise Guidelines: - Group verbal and written instruction with models to review the exercise physiology of the cardiovascular system and associated critical values. Provides general exercise guidelines with specific guidelines to  those with heart or lung disease.    Education: Flexibility, Balance, Mind/Body Relaxation: - Group verbal and visual presentation with interactive activity on the components of exercise prescription. Introduces F.I.T.T principle from ACSM for exercise prescriptions. Reviews F.I.T.T. principles of flexibility and balance exercise training including progression. Also discusses the mind body connection.  Reviews various relaxation techniques to help reduce and manage stress (i.e. Deep breathing, progressive muscle relaxation, and visualization). Balance handout provided to take home. Written material given at graduation.   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, L knee menicus tear    Cardiac Risk Stratification Low             6 Minute Walk:  6 Minute Walk     Row Name 10/31/21 1538 12/21/21 1001       6 Minute Walk   Phase Initial Discharge    Distance 1380 feet 1910 feet    Distance % Change -- 38.4 %    Distance Feet Change -- 530 ft    Walk Time 6 minutes 6 minutes    # of Rest Breaks 0 0    MPH 2.61 3.62    METS 4.72 5.73    RPE 7 11    Perceived Dyspnea  -- 0    VO2 Peak 16.54 20.05    Symptoms No Yes (comment)    Comments -- Back Pain    Resting HR 82 bpm 74 bpm    Resting BP 126/72 132/64    Resting Oxygen Saturation  97 % 95 %    Exercise Oxygen Saturation  during 6 min walk 97 % 97 %    Max Ex. HR 110 bpm 123 bpm    Max Ex. BP 146/74 144/84    2 Minute Post BP 122/64 --             Oxygen Initial Assessment:   Oxygen Re-Evaluation:   Oxygen Discharge (Final Oxygen Re-Evaluation):   Initial Exercise Prescription:  Initial Exercise Prescription -  10/31/21 1500       Date of Initial Exercise RX and Referring Provider   Date 10/31/21    Referring Provider [name]      Oxygen   Maintain Oxygen Saturation 88% or higher      Treadmill   MPH 3    Grade 2    Minutes 15    METs 4.12      Elliptical   Level 2    Speed 4.9    Minutes 15    METs 4      REL-XR   Level 4    Speed 50    Minutes 15    METs 4      Prescription Details   Frequency (times per week) 3    Duration Progress to 30 minutes of continuous aerobic without signs/symptoms of physical distress      Intensity  THRR 40-80% of Max Heartrate 122-162    Ratings of Perceived Exertion 11-13    Perceived Dyspnea 0-4      Progression   Progression Continue to progress workloads to maintain intensity without signs/symptoms of physical distress.      Resistance Training   Training Prescription Yes    Weight 10 lb    Reps 10-15             Perform Capillary Blood Glucose checks as needed.  Exercise Prescription Changes:   Exercise Prescription Changes     Row Name 10/31/21 1600 11/09/21 1000 11/15/21 1500 11/29/21 1500 12/13/21 1400     Response to Exercise   Blood Pressure (Admit) 126/72 -- 126/64 114/72 124/62   Blood Pressure (Exercise) 146/74 -- 142/82 180/82 178/74   Blood Pressure (Exit) 122/64 -- 128/70 110/62 118/60   Heart Rate (Admit) 82 bpm -- 86 bpm 96 bpm 91 bpm   Heart Rate (Exercise) 110 bpm -- 155 bpm 154 bpm 138 bpm   Heart Rate (Exit) 85 bpm -- 117 bpm 100 bpm 73 bpm   Oxygen Saturation (Admit) 97 % -- -- -- --   Oxygen Saturation (Exercise) 97 % -- -- -- --   Rating of Perceived Exertion (Exercise) 7 -- _0 Symptoms none -- none none none   Comments walk test results -- 3rd full day of exercise -- --   Duration -- -- Continue with 30 min of aerobic exercise without signs/symptoms of physical distress. Continue with 30 min of aerobic exercise without signs/symptoms of physical distress. Continue with 30  min of aerobic exercise without signs/symptoms of physical distress.   Intensity -- -- THRR unchanged THRR unchanged THRR unchanged     Progression   Progression -- -- Continue to progress workloads to maintain intensity without signs/symptoms of physical distress. Continue to progress workloads to maintain intensity without signs/symptoms of physical distress. Continue to progress workloads to maintain intensity without signs/symptoms of physical distress.   Average METs -- -- 4.86 5.13 5.3     Resistance Training   Training Prescription -- -- Yes Yes Yes   Weight -- -- 10 lb 10 lb 10 lb   Reps -- -- 10-15 10-15 10-15     Interval Training   Interval Training -- -- Yes Yes Yes   Equipment -- -- Treadmill Treadmill Elliptical   Comments -- -- speed between 2-6 mph speed between 3.5-6.5 mph speed 4-12     Treadmill   MPH -- -- 3.5  intervals up to 6 6.5 3.7   Grade -- -- 3.5 3.5 3.5   Minutes -- -- _1 METs -- -- 5.37 5.13 5.62     Elliptical   Level -- -- _2 Speed -- -- 5.4 5.4 4  speed vary with intervals: 4-13   Minutes -- -- _3 METs -- -- -- -- 4.6     REL-XR   Level -- -- 4 -- --   Minutes -- -- 15 -- --   METs -- -- 4.3 -- --     Home Exercise Plan   Plans to continue exercise at -- Home (comment)  walking, running, weights Home (comment)  walking, running, weights Home (comment)  walking, running, weights Home (comment)  walking, running, weights   Frequency -- Add 2 additional days to program exercise sessions. Add 2 additional days to program exercise sessions. Add 2 additional days  to program exercise sessions. Add 2 additional days to program exercise sessions.   Initial Home Exercises Provided -- 11/09/21 11/09/21 11/09/21 11/09/21     Oxygen   Maintain Oxygen Saturation -- -- 88% or higher 88% or higher 88% or higher            Exercise Comments:   Exercise Goals and Review:   Exercise Goals     Row Name 10/31/21 1601              Exercise Goals   Increase Physical Activity Yes       Intervention Provide advice, education, support and counseling about physical activity/exercise needs.;Develop an individualized exercise prescription for aerobic and resistive training based on initial evaluation findings, risk stratification, comorbidities and participant's personal goals.       Expected Outcomes Short Term: Attend rehab on a regular basis to increase amount of physical activity.;Long Term: Add in home exercise to make exercise part of routine and to increase amount of physical activity.;Long Term: Exercising regularly at least 3-5 days a week.       Increase Strength and Stamina Yes       Intervention Provide advice, education, support and counseling about physical activity/exercise needs.;Develop an individualized exercise prescription for aerobic and resistive training based on initial evaluation findings, risk stratification, comorbidities and participant's personal goals.       Expected Outcomes Short Term: Increase workloads from initial exercise prescription for resistance, speed, and METs.;Short Term: Perform resistance training exercises routinely during rehab and add in resistance training at home;Long Term: Improve cardiorespiratory fitness, muscular endurance and strength as measured by increased METs and functional capacity (6MWT)       Able to understand and use rate of perceived exertion (RPE) scale Yes       Intervention Provide education and explanation on how to use RPE scale       Expected Outcomes Short Term: Able to use RPE daily in rehab to express subjective intensity level;Long Term:  Able to use RPE to guide intensity level when exercising independently       Able to understand and use Dyspnea scale Yes       Intervention Provide education and explanation on how to use Dyspnea scale       Expected Outcomes Short Term: Able to use Dyspnea scale daily in rehab to express subjective sense of shortness of  breath during exertion;Long Term: Able to use Dyspnea scale to guide intensity level when exercising independently       Knowledge and understanding of Target Heart Rate Range (THRR) Yes       Intervention Provide education and explanation of THRR including how the numbers were predicted and where they are located for reference       Expected Outcomes Short Term: Able to state/look up THRR;Short Term: Able to use daily as guideline for intensity in rehab;Long Term: Able to use THRR to govern intensity when exercising independently       Able to check pulse independently Yes       Intervention Provide education and demonstration on how to check pulse in carotid and radial arteries.;Review the importance of being able to check your own pulse for safety during independent exercise       Expected Outcomes Short Term: Able to explain why pulse checking is important during independent exercise;Long Term: Able to check pulse independently and accurately       Understanding of Exercise Prescription Yes  Intervention Provide education, explanation, and written materials on patient's individual exercise prescription       Expected Outcomes Short Term: Able to explain program exercise prescription;Long Term: Able to explain home exercise prescription to exercise independently                Exercise Goals Re-Evaluation :  Exercise Goals Re-Evaluation     Row Name 11/09/21 1021 11/15/21 1542 11/29/21 1528 12/09/21 1108 12/13/21 1437     Exercise Goal Re-Evaluation   Exercise Goals Review Increase Physical Activity;Able to understand and use rate of perceived exertion (RPE) scale;Knowledge and understanding of Target Heart Rate Range (THRR);Understanding of Exercise Prescription;Able to understand and use Dyspnea scale;Increase Strength and Stamina;Able to check pulse independently Increase Physical Activity;Increase Strength and Stamina;Understanding of Exercise Prescription Increase Physical  Activity;Increase Strength and Stamina;Understanding of Exercise Prescription Increase Physical Activity;Increase Strength and Stamina;Understanding of Exercise Prescription Increase Physical Activity;Increase Strength and Stamina;Understanding of Exercise Prescription   Comments Reviewed home exercise with pt today.  Pt plans to walk/run and use weights at home for exercise.  Reviewed THR, pulse, RPE, sign and symptoms, pulse oximetery and when to call 911 or MD.  Also discussed weather considerations and indoor options.  Pt voiced understanding. Keith Roy is doing well in rehab for the first couple of sessions he has been here. He has already worked on the elliptical at a speed of 5.4 at level 4. He also is doing intervals on the treadmill, varying his speed between 2 and 6 mph with small incline. He is hitting his THR with each session thus far. We will continue to monitor as he progresses. Keith Roy has only attended rehab once since the last evaluation. He was able to do intervals of speed on the treadmill ranging from 3.5-6.5 mph, whil maintaining an incline of 3%. We will continue to monitor his progress in the program. Keith Roy is doing well in rehab.  He is planning to graduate early.  He wants to get to gym first to make sure he feels comfortable working out there and seeing if he has any questions.  He will let us know when next week.  He continues to look for a replacement for boxing.  He is also considering trying out swimming for exercise. Keith Roy continues to do well in rehab. He has been working primarily on the treadmill and elliptical. He has been doing intervals with the speed of the elliptical up 13. He still reaches his THR each session. His highwork loads have been resulting in an average 4-6 METS each time which is great. We will continue to monitor.   Expected Outcomes SHort; Start to add in cardio at home Long: continue to improve stamina Short: Continue to on elliptical and build up workload Long:  Continue to increase overall strength and endurance Short: Continue to increase workload on elliptical. Long: Continue to increase strength and stamina. Short: Get to gym to try before graduation Long: Continue to exercise indpendently Short: Continue to work on intervals with treadmill and elliptical Long: Continue to increase overall MET level            Discharge Exercise Prescription (Final Exercise Prescription Changes):  Exercise Prescription Changes - 12/13/21 1400       Response to Exercise   Blood Pressure (Admit) 124/62    Blood Pressure (Exercise) 178/74    Blood Pressure (Exit) 118/60    Heart Rate (Admit) 91 bpm    Heart Rate (Exercise) 138 bpm    Heart Rate (  Exit) 73 bpm    Rating of Perceived Exertion (Exercise) 12    Symptoms none    Duration Continue with 30 min of aerobic exercise without signs/symptoms of physical distress.    Intensity THRR unchanged      Progression   Progression Continue to progress workloads to maintain intensity without signs/symptoms of physical distress.    Average METs 5.3      Resistance Training   Training Prescription Yes    Weight 10 lb    Reps 10-15      Interval Training   Interval Training Yes    Equipment Elliptical    Comments speed 4-12      Treadmill   MPH 3.7    Grade 3.5    Minutes 15    METs 5.62      Elliptical   Level 6    Speed 4   speed vary with intervals: 4-13   Minutes 15    METs 4.6      Home Exercise Plan   Plans to continue exercise at Home (comment)   walking, running, weights   Frequency Add 2 additional days to program exercise sessions.    Initial Home Exercises Provided 11/09/21      Oxygen   Maintain Oxygen Saturation 88% or higher             Nutrition:  Target Goals: Understanding of nutrition guidelines, daily intake of sodium <1571m, cholesterol <205m calories 30% from fat and 7% or less from saturated fats, daily to have 5 or more servings of fruits and  vegetables.  Education: All About Nutrition: -Group instruction provided by verbal, written material, interactive activities, discussions, models, and posters to present general guidelines for heart healthy nutrition including fat, fiber, MyPlate, the role of sodium in heart healthy nutrition, utilization of the nutrition label, and utilization of this knowledge for meal planning. Follow up email sent as well. Written material given at graduation.   Biometrics:  Pre Biometrics - 10/31/21 1602       Pre Biometrics   Height _0  (1.778 m)    Weight 227 lb 11.2 oz (103.3 kg)    Waist Circumference 40.5 inches    Hip Circumference 40 inches    Waist to Hip Ratio 1.01 %    BMI (Calculated) 32.67    Single Leg Stand 30 seconds             Post Biometrics - 12/21/21 1002        Post  Biometrics   Single Leg Stand 30 seconds             Nutrition Therapy Plan and Nutrition Goals:  Nutrition Therapy & Goals - 10/31/21 1207       Nutrition Therapy   Diet Heart healthy, low Na    Drug/Food Interactions Statins/Certain Fruits;Coumadin/Vit K    Protein (specify units) 80-85g    Fiber 32 grams    Whole Grain Foods 3 servings    Saturated Fats 18 max. grams    Fruits and Vegetables 8 servings/day    Sodium 2 grams      Personal Nutrition Goals   Nutrition Goal ST: Practice MyPlate guidelines LT: include at least 1/2 grains as whole grains, eat at least 5-8 servings of fruits/vegetables per day, limit saturated fat <18g/day    Comments [age].o. M admitted to cardiac rehab s/p AVR. PMHx includes depression, HLD. Relevant medications includes hydrocodone, rosuvastatin, warfarin. Mylen reports eating a lot of meat,  but has cut back on some of his red meat consumption. He has backyard chickens so he eats 2-3 whole eggs per day; discussed other ways to lower cholesterol such as limiting saturated fat and including more soluble fiber - he likes the idea of using oats as a binder for  food like meatloaf to improve nutrition quality. He reports during the winter he eats more venison which is lean and lower in saturated fat. He reports enjoying vegetables, but is limited by his warfarin. his healthcare team suggested 1/2 cup of vitamin K foods MWF. Discussed MNT for warfarin is geared towrds consistentcy of vitamin K rich foods and working with healthcare team to manage INR. He reports being told to limit cranberries and cashews as well; while cashews do have vitmain K, they are lower than 66mg and are considered low vitamin K foods at 9.6 mcg per 1oz (1 serving) and cranberries are no longer limited with warfarin, but can still have added sugar which is limited in a heart healthy diet. Provided vitamin K content of food and warfarin MNT handout from the Academy of [employer]etics. He enjoys berries, spaghetti squash, zucchini, and kidney beans when he makes chili. He would like to add in more fruit/vegetable servings as his first goal. Discussed general heart healthy eating.      Intervention Plan   Intervention Prescribe, educate and counsel regarding individualized specific dietary modifications aiming towards targeted core components such as weight, hypertension, lipid management, diabetes, heart failure and other comorbidities.;Nutrition handout(s) given to patient.    Expected Outcomes Short Term Goal: Understand basic principles of dietary content, such as calories, fat, sodium, cholesterol and nutrients.;Short Term Goal: A plan has been developed with personal nutrition goals set during dietitian appointment.;Long Term Goal: Adherence to prescribed nutrition plan.             Nutrition Assessments:  MEDIFICTS Score Key: ?70 Need to make dietary changes  40-70 Heart Healthy Diet ? 40 Therapeutic Level Cholesterol Diet  [facility]  Picture Your Plate Total Score on Admission 57      Picture  Your Plate Scores: <<58Unhealthy dietary pattern with much room for improvement. 41-50 Dietary pattern unlikely to meet recommendations for good health and room for improvement. 51-60 More healthful dietary pattern, with some room for improvement.  >60 Healthy dietary pattern, although there may be some specific behaviors that could be improved.    Nutrition Goals Re-Evaluation:  Nutrition Goals Re-Evaluation     RBraidwoodName 12/09/21 1115             Goals   Nutrition Goal ST: Practice MyPlate guidelines LT: include at least 1/2 grains as whole grains, eat at least 5-8 servings of fruits/vegetables per day, limit saturated fat <18g/day       Comment Keith Roy doing well in rehab.  He is back to eating again and has thus gained weight.  He is trying to get a good variety and to watch his sodium.       Expected Outcome Continue to add variety and protein.                Nutrition Goals Discharge (Final Nutrition Goals Re-Evaluation):  Nutrition Goals Re-Evaluation - 12/09/21 1115       Goals   Nutrition Goal ST: Practice MyPlate guidelines LT: include at least 1/2 grains as whole grains, eat at least 5-8 servings of fruits/vegetables per day,  limit saturated fat <18g/day    Comment Keith Roy is doing well in rehab.  He is back to eating again and has thus gained weight.  He is trying to get a good variety and to watch his sodium.    Expected Outcome Continue to add variety and protein.             Psychosocial: Target Goals: Acknowledge presence or absence of significant depression and/or stress, maximize coping skills, provide positive support system. Participant is able to verbalize types and ability to use techniques and skills needed for reducing stress and depression.   Education: Stress, Anxiety, and Depression - Group verbal and visual presentation to define topics covered.  Reviews how body is impacted by stress, anxiety, and depression.  Also discusses healthy ways to  reduce stress and to treat/manage anxiety and depression.  Written material given at graduation.   Education: Sleep Hygiene -Provides group verbal and written instruction about how sleep can affect your health.  Define sleep hygiene, discuss sleep cycles and impact of sleep habits. Review good sleep hygiene tips.    Initial Review & Psychosocial Screening:  Initial Psych Review & Screening - 10/24/21 1007       Initial Review   Current issues with Current Stress Concerns;History of Depression    Source of Stress Concerns --    Comments unable to continue boxing, friend just passed away in motorcycle accident      [place]? No    Concerns No support system    Comments Per notes has a wife and baby, stated he did not have a support system after the loss of his friend      Barriers   Psychosocial barriers to participate in program The patient should benefit from training in stress management and relaxation.;There are no identifiable barriers or psychosocial needs.      Screening Interventions   Interventions Encouraged to exercise;To provide support and resources with identified psychosocial needs;Provide feedback about the scores to participant    Expected Outcomes Short Term goal: Utilizing psychosocial counselor, staff and physician to assist with identification of specific Stressors or current issues interfering with healing process. Setting desired goal for each stressor or current issue identified.;Long Term Goal: Stressors or current issues are controlled or eliminated.;Short Term goal: Identification and review with participant of any Quality of Life or Depression concerns found by scoring the questionnaire.;Long Term goal: The participant improves quality of Life and PHQ9 Scores as seen by post scores and/or verbalization of changes             Quality of Life Scores:   Quality of Life - 12/21/21 1029       Quality of Life   Select Quality of  Life      Quality of Life Scores   Health/Function Pre 11.67 %    Health/Function Post 18.83 %    Health/Function % Change 61.35 %    Socioeconomic Pre 20 %    Socioeconomic Post 19.71 %    Socioeconomic % Change  -1.45 %    Psych/Spiritual Pre 12 %    Psych/Spiritual Post 16.86 %    Psych/Spiritual % Change 40.5 %    Family Pre 12 %    Family Post 17.1 %    Family % Change 42.5 %    GLOBAL Pre 13.3 %    GLOBAL Post 18.35 %    GLOBAL % Change 37.97 %  Scores of 19 and below usually indicate a poorer quality of life in these areas.  A difference of  2-3 points is a clinically meaningful difference.  A difference of 2-3 points in the total score of the Quality of Life Index has been associated with significant improvement in overall quality of life, self-image, physical symptoms, and general health in studies assessing change in quality of life.  PHQ-9: Review Flowsheet       12/21/2021 10/31/2021  Depression screen PHQ 2/9  Decreased Interest 1 2  Down, Depressed, Hopeless 2 2  PHQ - 2 Score 3 4  Altered sleeping 2 3  Tired, decreased energy 2 2  Change in appetite 3 2  Feeling bad or failure about yourself  3 2  Trouble concentrating 1 1  Moving slowly or fidgety/restless 0 0  Suicidal thoughts 1 2  PHQ-9 Score 15 16  Difficult doing work/chores Very difficult Very difficult   Interpretation of Total Score  Total Score Depression Severity:  1-4 = Minimal depression, 5-9 = Mild depression, 10-14 = Moderate depression, 15-19 = Moderately severe depression, 20-27 = Severe depression   Psychosocial Evaluation and Intervention:  Psychosocial Evaluation - 10/24/21 1014       Psychosocial Evaluation & Interventions   Interventions Encouraged to exercise with the program and follow exercise prescription;Stress management education;Relaxation education    Comments Keith Roy is coming to cardiac rehab 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 He is already back to work.  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 He was in the middle of training for a competition when he was told he needed surgery. He does not report any chest pain. When asked about his support system, he stated he does not have any after his one friend died in a motorcycle accident one week after his surgery. Per the [place] notes, he has a wife and child but did not mention them at this time nor did he want to go into a lot of detail about his personal life. His goal is to at least get back in the gym and to know what his heart can handle.    Expected Outcomes Short: attend cardiac rehab for education and exercise. Long: develop and maintian positive self care habits.    Continue Psychosocial Services  Follow up required by staff             Psychosocial Re-Evaluation:  Psychosocial Re-Evaluation     Keith Roy Name 12/09/21 1111             Psychosocial Re-Evaluation   Current issues with Current Stress Concerns;Current Psychotropic Meds       Comments Keith Roy is doing well in rehab. His biggest stressors are work and not being able to box anymore.  Work has ben very stressful this week and gets in the way of getting to rehab so he will need to graduate early.  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  He is still learning to cope with his new lifestyle and trying to find other alternatives.  He is on meds to help with anger management.       Expected Outcomes Short: Continue to find outlet for anger Long: Conitnue to montior stressors and find outlet       Interventions Encouraged to attend Cardiac Rehabilitation for the exercise       Continue Psychosocial Services  Follow up required by  staff                Psychosocial Discharge (Final Psychosocial Re-Evaluation):  Psychosocial Re-Evaluation - 12/09/21 1111       Psychosocial Re-Evaluation   Current issues with Current Stress Concerns;Current Psychotropic  Meds    Comments Keith Roy is doing well in rehab. His biggest stressors are work and not being able to box anymore.  Work has ben very stressful this week and gets in the way of getting to rehab so he will need to graduate early.  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  He is still learning to cope with his new lifestyle and trying to find other alternatives.  He is on meds to help with anger management.    Expected Outcomes Short: Continue to find outlet for anger Long: Conitnue to montior stressors and find outlet    Interventions Encouraged to attend Cardiac Rehabilitation for the exercise    Continue Psychosocial Services  Follow up required by staff             Vocational Rehabilitation: Provide vocational rehab assistance to qualifying candidates.   Vocational Rehab Evaluation & Intervention:  Vocational Rehab - 10/24/21 1006       Initial Vocational Rehab Evaluation & Intervention   Assessment shows need for Vocational Rehabilitation No             Education: Education Goals: Education classes will be provided on a variety of topics geared toward better understanding of heart health and risk factor modification. Participant will state understanding/return demonstration of topics presented as noted by education test scores.  Learning Barriers/Preferences:  Learning Barriers/Preferences - 10/24/21 1006       Learning Barriers/Preferences   Learning Barriers None    Learning Preferences Individual Instruction             General Cardiac Education Topics:  AED/CPR: - Group verbal and written instruction with the use of models to demonstrate the basic use of the AED with the basic ABC's of resuscitation.   Anatomy and Cardiac Procedures: - Group verbal and visual presentation and models provide information about basic cardiac anatomy and function. Reviews the testing methods done to diagnose heart disease and  the outcomes of the test results. Describes the treatment choices: Medical Management, Angioplasty, or Coronary Bypass Surgery for treating various heart conditions including Myocardial Infarction, Angina, Valve Disease, and Cardiac Arrhythmias.  Written material given at graduation. Flowsheet Row Cardiac Rehab from 10/31/2021 in [facility]  Education need identified 10/31/21       Medication Safety: - Group verbal and visual instruction to review commonly prescribed medications for heart and lung disease. Reviews the medication, class of the drug, and side effects. Includes the steps to properly store meds and maintain the prescription regimen.  Written material given at graduation.   Intimacy: - Group verbal instruction through game format to discuss how heart and lung disease can affect sexual intimacy. Written material given at graduation..   Know Your Numbers and Heart Failure: - Group verbal and visual instruction to discuss disease risk factors for cardiac and pulmonary disease and treatment options.  Reviews associated critical values for Overweight/Obesity, Hypertension, Cholesterol, and Diabetes.  Discusses basics of heart failure: signs/symptoms and treatments.  Introduces Heart Failure Zone chart for action plan for heart failure.  Written material given at graduation.   Infection Prevention: - Provides verbal and written material to  individual with discussion of infection control including proper hand washing and proper equipment cleaning during exercise session. Flowsheet Row Cardiac Rehab from 10/31/2021 in [facility] Cardiac and Pulmonary Rehab  Date 10/31/21  Educator [facility]  Instruction Review Code 1- Verbalizes Understanding       Falls Prevention: - Provides verbal and written material to individual with discussion of falls prevention and safety. Flowsheet Row Cardiac Rehab from 10/31/2021 in [facility] Cardiac and Pulmonary Rehab  Date 10/31/21  Educator [facility]   Instruction Review Code 1- Verbalizes Understanding       Other: -Provides group and verbal instruction on various topics (see comments)   Knowledge Questionnaire Score:  Knowledge Questionnaire Score - 12/21/21 1030       Knowledge Questionnaire Score   Pre Score 24/26    Post Score 25/26             Core Components/Risk Factors/Patient Goals at Admission:  Personal Goals and Risk Factors at Admission - 10/31/21 1604       Core Components/Risk Factors/Patient Goals on Admission    Weight Management Yes;Obesity;Weight Loss;Weight Maintenance    Intervention Weight Management: Develop a combined nutrition and exercise program designed to reach desired caloric intake, while maintaining appropriate intake of nutrient and fiber, sodium and fats, and appropriate energy expenditure required for the weight goal.;Weight Management: Provide education and appropriate resources to help participant work on and attain dietary goals.;Weight Management/Obesity: Establish reasonable short term and long term weight goals.;Obesity: Provide education and appropriate resources to help participant work on and attain dietary goals.    Admit Weight 227 lb 11.2 oz (103.3 kg)    Goal Weight: Short Term 225 lb (102.1 kg)    Goal Weight: Long Term 220 lb (99.8 kg)    Expected Outcomes Short Term: Continue to assess and modify interventions until short term weight is achieved;Long Term: Adherence to nutrition and physical activity/exercise program aimed toward attainment of established weight goal;Weight Loss: Understanding of general recommendations for a balanced deficit meal plan, which promotes 1-2 lb weight loss per week and includes a negative energy balance of [phone] kcal/d;Weight Maintenance: Understanding of the daily nutrition guidelines, which includes 25-35% calories from fat, 7% or less cal from saturated fats, less than 259m cholesterol, less than 1.5gm of sodium, & 5 or more servings of  fruits and vegetables daily;Understanding recommendations for meals to include 15-35% energy as protein, 25-35% energy from fat, 35-60% energy from carbohydrates, less than 2053mof dietary cholesterol, 20-35 gm of total fiber daily;Understanding of distribution of calorie intake throughout the day with the consumption of 4-5 meals/snacks    Lipids Yes    Intervention Provide education and support for participant on nutrition & aerobic/resistive exercise along with prescribed medications to achieve LDL <7087mHDL >25m52m  Expected Outcomes Short Term: Participant states understanding of desired cholesterol values and is compliant with medications prescribed. Participant is following exercise prescription and nutrition guidelines.;Long Term: Cholesterol controlled with medications as prescribed, with individualized exercise RX and with personalized nutrition plan. Value goals: LDL < 70mg45mL > 40 mg.             Education:Diabetes - Individual verbal and written instruction to review signs/symptoms of diabetes, desired ranges of glucose level fasting, after meals and with exercise. Acknowledge that pre and post exercise glucose checks will be done for 3 sessions at entry of program.   Core Components/Risk Factors/Patient Goals Review:   Goals and Risk Factor Review     Row  Name 12/09/21 1117             Core Components/Risk Factors/Patient Goals Review   Personal Goals Review Weight Management/Obesity;Lipids       Review Uriel is doing well in rehab.  He is gaining weight again as he has started to eat again, but is not exercising like he used to do to not being able to box.  He is still trying to find something to replace boxing.  He is going to try swimming.   We talked about getting to gym again and watching excess eating.  His pressures continues to do well       Expected Outcomes Conitnue to work on weight maintenance and monitoring risk factors.                Core  Components/Risk Factors/Patient Goals at Discharge (Final Review):   Goals and Risk Factor Review - 12/09/21 1117       Core Components/Risk Factors/Patient Goals Review   Personal Goals Review Weight Management/Obesity;Lipids    Review Tomoya is doing well in rehab.  He is gaining weight again as he has started to eat again, but is not exercising like he used to do to not being able to box.  He is still trying to find something to replace boxing.  He is going to try swimming.   We talked about getting to gym again and watching excess eating.  His pressures continues to do well    Expected Outcomes Conitnue to work on weight maintenance and monitoring risk factors.             ITP Comments:  ITP Comments     Row Name 10/24/21 1022 10/31/21 1521 11/23/21 1037       ITP Comments Initial phone call completed. Diagnosis [facility] be [facility]. EP Orientation scheduled for Monday 10/9 at 10am. Completed 6MWT and gym orientation. Initial ITP created and sent for review to Dr. Emily Filbert, Medical Director. 30 Day review completed. Medical Director ITP review done, changes made as directed, and signed approval by Medical Director.    NEW TO PROGRAM              Comments: Discharge ITP

## 2021-12-21 NOTE — Progress Notes (Signed)
Patient discharged  no  need for 30 day note

## 2021-12-21 NOTE — Progress Notes (Signed)
Discharge Summary: Keith Roy (DOB: 1985/06/24)  Keith Roy graduated today from  rehab with 9 sessions completed.  Details of the patient's exercise prescription and what He needs to do in order to continue the prescription and progress were discussed with patient.  Patient was given a copy of prescription and goals.  Patient verbalized understanding.  [name] plans to continue to exercise by walking, running, weights.   [place] Name 10/31/21 1538 12/21/21 1001       6 Minute Walk   Phase Initial Discharge    Distance 1380 feet 1910 feet    Distance % Change -- 38.4 %    Distance Feet Change -- 530 ft    Walk Time 6 minutes 6 minutes    # of Rest Breaks 0 0    MPH 2.61 3.62    METS 4.72 5.73    RPE 7 11    Perceived Dyspnea  -- 0    VO2 Peak 16.54 20.05    Symptoms No Yes (comment)    Comments -- Back Pain    Resting HR 82 bpm 74 bpm    Resting BP 126/72 132/64    Resting Oxygen Saturation  97 % 95 %    Exercise Oxygen Saturation  during 6 min walk 97 % 97 %    Max Ex. HR 110 bpm 123 bpm    Max Ex. BP 146/74 144/84    2 Minute Post BP 122/64 --

## 2021-12-21 NOTE — Patient Instructions (Signed)
Discharge Patient Instructions  Patient Details  Name: Keith Roy MRN: [id number] Date of Birth: 05-21-1985 Referring Provider:  [place]   Number of Visits: 9  Reason for Discharge:  Patient reached a stable level of exercise. Patient independent in their exercise. Early Exit:  Back to work  Diagnosis:  S/P AVR (aortic valve replacement)  Hx of aortic root repair  Initial Exercise Prescription:  Initial Exercise Prescription - 10/31/21 1500       Date of Initial Exercise RX and Referring Provider   Date 10/31/21    Referring Provider [name]      Oxygen   Maintain Oxygen Saturation 88% or higher      Treadmill   MPH 3    Grade 2    Minutes 15    METs 4.12      Elliptical   Level 2    Speed 4.9    Minutes 15    METs 4      REL-XR   Level 4    Speed 50    Minutes 15    METs 4      Prescription Details   Frequency (times per week) 3    Duration Progress to 30 minutes of continuous aerobic without signs/symptoms of physical distress      Intensity   THRR 40-80% of Max Heartrate 122-162    Ratings of Perceived Exertion 11-13    Perceived Dyspnea 0-4      Progression   Progression Continue to progress workloads to maintain intensity without signs/symptoms of physical distress.      Resistance Training   Training Prescription Yes    Weight 10 lb    Reps 10-15             Discharge Exercise Prescription (Final Exercise Prescription Changes):  Exercise Prescription Changes - 12/13/21 1400       Response to Exercise   Blood Pressure (Admit) 124/62    Blood Pressure (Exercise) 178/74    Blood Pressure (Exit) 118/60    Heart Rate (Admit) 91 bpm    Heart Rate (Exercise) 138 bpm    Heart Rate (Exit) 73 bpm    Rating of Perceived Exertion (Exercise) 12    Symptoms none    Duration Continue with 30 min of aerobic exercise without signs/symptoms of physical distress.    Intensity THRR unchanged      Progression    Progression Continue to progress workloads to maintain intensity without signs/symptoms of physical distress.    Average METs 5.3      Resistance Training   Training Prescription Yes    Weight 10 lb    Reps 10-15      Interval Training   Interval Training Yes    Equipment Elliptical    Comments speed 4-12      Treadmill   MPH 3.7    Grade 3.5    Minutes 15    METs 5.62      Elliptical   Level 6    Speed 4   speed vary with intervals: 4-13   Minutes 15    METs 4.6      Home Exercise Plan   Plans to continue exercise at Home (comment)   walking, running, weights   Frequency Add 2 additional days to program exercise sessions.    Initial Home Exercises Provided 11/09/21      Oxygen   Maintain Oxygen Saturation 88% or higher  Functional Capacity:  6 Minute Walk     Row Name 10/31/21 1538 12/21/21 1001       6 Minute Walk   Phase Initial Discharge    Distance 1380 feet 1910 feet    Distance % Change -- 38.4 %    Distance Feet Change -- 530 ft    Walk Time 6 minutes 6 minutes    # of Rest Breaks 0 0    MPH 2.61 3.62    METS 4.72 5.73    RPE 7 11    Perceived Dyspnea  -- 0    VO2 Peak 16.54 20.05    Symptoms No Yes (comment)    Comments -- Back Pain    Resting HR 82 bpm 74 bpm    Resting BP 126/72 132/64    Resting Oxygen Saturation  97 % 95 %    Exercise Oxygen Saturation  during 6 min walk 97 % 97 %    Max Ex. HR 110 bpm 123 bpm    Max Ex. BP 146/74 144/84    2 Minute Post BP 122/64 --            Nutrition & Weight - Outcomes:  Pre Biometrics - 10/31/21 1602       Pre Biometrics   Height '5\' 10"'$  (1.778 m)    Weight 227 lb 11.2 oz (103.3 kg)    Waist Circumference 40.5 inches    Hip Circumference 40 inches    Waist to Hip Ratio 1.01 %    BMI (Calculated) 32.67    Single Leg Stand 30 seconds             Post Biometrics - 12/21/21 1002        Post  Biometrics   Single Leg Stand 30 seconds             Nutrition:   Nutrition Therapy & Goals - 10/31/21 1207       Nutrition Therapy   Diet Heart healthy, low Na    Drug/Food Interactions Statins/Certain Fruits;Coumadin/Vit K    Protein (specify units) 80-85g    Fiber 32 grams    Whole Grain Foods 3 servings    Saturated Fats 18 max. grams    Fruits and Vegetables 8 servings/day    Sodium 2 grams      Personal Nutrition Goals   Nutrition Goal ST: Practice MyPlate guidelines LT: include at least 1/2 grains as whole grains, eat at least 5-8 servings of fruits/vegetables per day, limit saturated fat <18g/day    Comments 36 y.o. M admitted to cardiac rehab s/p AVR. PMHx includes depression, HLD. Relevant medications includes hydrocodone, rosuvastatin, warfarin. Montgomery reports eating a lot of meat, but has cut back on some of his red meat consumption. He has backyard chickens so he eats 2-3 whole eggs per day; discussed other ways to lower cholesterol such as limiting saturated fat and including more soluble fiber - he likes the idea of using oats as a binder for food like meatloaf to improve nutrition quality. He reports during the winter he eats more venison which is lean and lower in saturated fat. He reports enjoying vegetables, but is limited by his warfarin. his healthcare team suggested 1/2 cup of vitamin K foods MWF. Discussed MNT for warfarin is geared towrds consistentcy of vitamin K rich foods and working with healthcare team to manage INR. He reports being told to limit cranberries and cashews as well; while cashews do have vitmain K, they are  lower than 58mg and are considered low vitamin K foods at 9.6 mcg per 1oz (1 serving) and cranberries are no longer limited with warfarin, but can still have added sugar which is limited in a heart healthy diet. Provided vitamin K content of food and warfarin MNT handout from the [employer]. He enjoys berries, spaghetti squash, zucchini, and kidney beans when he makes chili. He would like to add  in more fruit/vegetable servings as his first goal. Discussed general heart healthy eating.      Intervention Plan   Intervention Prescribe, educate and counsel regarding individualized specific dietary modifications aiming towards targeted core components such as weight, hypertension, lipid management, diabetes, heart failure and other comorbidities.;Nutrition handout(s) given to patient.    Expected Outcomes Short Term Goal: Understand basic principles of dietary content, such as calories, fat, sodium, cholesterol and nutrients.;Short Term Goal: A plan has been developed with personal nutrition goals set during dietitian appointment.;Long Term Goal: Adherence to prescribed nutrition plan.

## 2021-12-22 ENCOUNTER — Ambulatory Visit: Payer: No Typology Code available for payment source

## 2021-12-26 ENCOUNTER — Ambulatory Visit: Payer: No Typology Code available for payment source

## 2021-12-28 ENCOUNTER — Ambulatory Visit: Payer: No Typology Code available for payment source

## 2021-12-29 ENCOUNTER — Ambulatory Visit: Payer: No Typology Code available for payment source

## 2022-01-02 ENCOUNTER — Ambulatory Visit: Payer: No Typology Code available for payment source

## 2022-01-04 ENCOUNTER — Ambulatory Visit: Payer: No Typology Code available for payment source

## 2022-01-05 ENCOUNTER — Ambulatory Visit: Payer: No Typology Code available for payment source

## 2022-01-09 ENCOUNTER — Ambulatory Visit: Payer: No Typology Code available for payment source

## 2022-01-11 ENCOUNTER — Ambulatory Visit: Payer: No Typology Code available for payment source

## 2022-01-12 ENCOUNTER — Ambulatory Visit: Payer: No Typology Code available for payment source

## 2022-01-18 ENCOUNTER — Ambulatory Visit: Payer: No Typology Code available for payment source

## 2022-01-19 ENCOUNTER — Ambulatory Visit: Payer: No Typology Code available for payment source

## 2022-01-25 ENCOUNTER — Ambulatory Visit: Payer: No Typology Code available for payment source

## 2022-01-26 ENCOUNTER — Ambulatory Visit: Payer: No Typology Code available for payment source

## 2022-01-30 ENCOUNTER — Ambulatory Visit: Payer: No Typology Code available for payment source

## 2022-02-10 ENCOUNTER — Other Ambulatory Visit: Payer: Self-pay

## 2022-02-10 ENCOUNTER — Emergency Department: Payer: No Typology Code available for payment source

## 2022-02-10 ENCOUNTER — Emergency Department
Admission: EM | Admit: 2022-02-10 | Discharge: 2022-02-10 | Disposition: A | Discharge status: Home / Self Care | Payer: No Typology Code available for payment source | Attending: Emergency Medicine | Admitting: Emergency Medicine

## 2022-02-10 DIAGNOSIS — Z7901 Long term (current) use of anticoagulants: Secondary | ICD-10-CM | POA: Insufficient documentation

## 2022-02-10 DIAGNOSIS — R0600 Dyspnea, unspecified: Secondary | ICD-10-CM | POA: Diagnosis not present

## 2022-02-10 DIAGNOSIS — R079 Chest pain, unspecified: Secondary | ICD-10-CM | POA: Insufficient documentation

## 2022-02-10 LAB — CBC
HCT: 46.1 % (ref 39.0–52.0)
Hemoglobin: 15.4 g/dL (ref 13.0–17.0)
MCH: 29.8 pg (ref 26.0–34.0)
MCHC: 33.4 g/dL (ref 30.0–36.0)
MCV: 89.2 fL (ref 80.0–100.0)
Platelets: 228 10*3/uL (ref 150–400)
RBC: 5.17 MIL/uL (ref 4.22–5.81)
RDW: 13.1 % (ref 11.5–15.5)
WBC: 6.9 10*3/uL (ref 4.0–10.5)
nRBC: 0 % (ref 0.0–0.2)

## 2022-02-10 LAB — BASIC METABOLIC PANEL
Anion gap: 11 (ref 5–15)
BUN: 16 mg/dL (ref 6–20)
CO2: 23 mmol/L (ref 22–32)
Calcium: 8.9 mg/dL (ref 8.9–10.3)
Chloride: 101 mmol/L (ref 98–111)
Creatinine, Ser: 1 mg/dL (ref 0.61–1.24)
GFR, Estimated: >60 mL/min (ref >60)
Glucose, Bld: 136 mg/dL — ABNORMAL HIGH (ref 70–99)
Potassium: 3.5 mmol/L (ref 3.5–5.1)
Sodium: 135 mmol/L (ref 135–145)

## 2022-02-10 LAB — TROPONIN I (HIGH SENSITIVITY)
Troponin I (High Sensitivity): 5 ng/L (ref <18)
Troponin I (High Sensitivity): 6 ng/L (ref <18)

## 2022-02-10 LAB — PROTIME-INR
INR: 1.7 — ABNORMAL HIGH (ref 0.8–1.2)
Prothrombin Time: 19.8 seconds — ABNORMAL HIGH (ref 11.4–15.2)

## 2022-02-10 MED ORDER — IOHEXOL 350 MG/ML SOLN
100.0000 mL | Freq: Once | INTRAVENOUS | Status: AC | PRN
  Administered 2022-02-10: 100 mL via INTRAVENOUS

## 2022-02-10 NOTE — ED Notes (Signed)
Pt verbalizes understanding of discharge instructions. Opportunity for questioning and answers were provided. Pt discharged from ED to home.   ? ?

## 2022-02-10 NOTE — Discharge Instructions (Signed)
Your EKG cardiac enzymes and CAT scan of your chest was reassuring.  You can take Tylenol for pain.  You do have a thoracic aortic aneurysm which should be followed with yearly CAT scans.

## 2022-02-10 NOTE — ED Triage Notes (Addendum)
Pt to ED via POV from home. Pt reports left sided CP that started last night. Pt reports pain has been intermittent with radiation to left arm. Pt reports current chest pressure 2/10. Pt on warfarin. Pt also endorse nausea, SOB and mild HA. Pt with hx open heart surgery and valve replacement.

## 2022-02-10 NOTE — ED Notes (Signed)
Pt reports 3/10 pain in lower back and 2/10 pain in chest.

## 2022-02-10 NOTE — ED Provider Notes (Signed)
[facility] Provider Note    Event Date/Time   First MD Initiated Contact with Patient 02/10/22 1654     (approximate)   History   Chest Pain   HPI  Zephyr Ridley is a 37 y.o. male with past medical history of thoracic aortic aneurysm and bicuspid aortic valve status post aortic valve replacement with mechanical valve and ascending aortic arch repair in July 2023 on warfarin who p/w CP.  Symptoms started last night.  Patient describes a tight sensation in the center of his chest does not radiate.  He does endorse some associated dyspnea.  Pain is nonexertional nonpleuritic has been coming and going but does not identify any clear exacerbating alleviating factors.  Denies history of similar.  He takes warfarin with goal INR 1.5-2.      History reviewed. No pertinent past medical history.  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 10/24/2021   Fracture of orbital floor, left side, initial encounter for closed fracture ([place]) 10/24/2021   History of mechanical aortic valve replacement 10/24/2021   Low back pain, unspecified 10/24/2021   Major depressive disorder, recurrent, unspecified ([place]) 10/24/2021   Melanocytic nevi of trunk 10/24/2021   Thoracic aortic aneurysm without rupture ([place]) 10/24/2021   Strain of muscle, fascia and tendon of other parts of biceps, left arm, initial encounter 10/24/2021     Physical Exam  Triage Vital Signs: ED Triage Vitals  Enc Vitals Group     BP 02/10/22 1552 127/82     Pulse Rate 02/10/22 1554 88     Resp 02/10/22 1545 18     Temp 02/10/22 1545 98 F (36.7 C)     Temp Source 02/10/22 1545 Oral     SpO2 02/10/22 1545 98 %     Weight --      Height --      Head Circumference --      Peak Flow --      Pain Score 02/10/22 1526 2     Pain Loc --      Pain Edu? --      Excl. in [place]? --     Most recent vital signs: Vitals:   02/10/22 1800 02/10/22 1815   BP: 134/84   Pulse: 86 77  Resp: 17 19  Temp:    SpO2: 94% 100%     General: Awake, no distress.  CV:  Good peripheral perfusion.  Clicking of mechanical aortic valve heard across the room Resp:  Normal effort.  Lungs are clear Abd:  No distention.  Neuro:             Awake, Alert, Oriented x 3  Other:  No peripheral edema   ED Results / Procedures / Treatments  Labs (all labs ordered are listed, but only abnormal results are displayed) Labs Reviewed  BASIC METABOLIC PANEL - Abnormal; Notable for the following components:      Result Value   Glucose, Bld 136 (*)    All other components within normal limits  PROTIME-INR - Abnormal; Notable for the following components:   Prothrombin Time 19.8 (*)    INR 1.7 (*)    All other components within normal limits  CBC  TROPONIN I (HIGH SENSITIVITY)  TROPONIN I (HIGH SENSITIVITY)     EKG  EKG reviewed interpreted by myself shows sinus rhythm normal axis, high voltage, inferior Q waves   RADIOLOGY I reviewed and  interpreted the CXR which does not show any acute cardiopulmonary process    PROCEDURES:  Critical Care performed: No  Procedures   MEDICATIONS ORDERED IN ED: Medications  iohexol (OMNIPAQUE) 350 MG/ML injection 100 mL (100 mLs Intravenous Contrast Given 02/10/22 1816)     IMPRESSION / MDM / ASSESSMENT AND PLAN / ED COURSE  I reviewed the triage vital signs and the nursing notes.                              Patient's presentation is most consistent with acute presentation with potential threat to life or bodily function.  Differential diagnosis includes, but is not limited to, aortic dissection, ACS, pulmonary embolism, musculoskeletal pain, GERD  Patient is a 37 year old male with history of bicuspid aortic valve and thoracic aortic aneurysm status postrepair and mechanical aortic valve placement who presents with chest pain.  Symptoms started last night described as tightness that comes and goes no  exertional or clear pleuritic nature of the pain does feel pain currently.  Denies history of similar.  Denies dyspnea or recent illnesses fevers chills cough.  Patient's vitals are reassuring blood pressure 136/88, he had been concerned that blood pressure was elevated at home but is normal now.  On exam he looks well he has a click of the aortic valve.  Labs including first troponin is negative.  EKG without clear ischemic changes.  Chest x-ray without widening of the mediastinum.  Given patient's history of aortic repair multi and CTA to rule out dissection although clinically my suspicion for this is low, given the intermittent symptoms which do not radiate to the back and are not tearing and patient is overall well appearance.  Patient not currently wanting anything for pain.  Repeat troponin negative.  CT angio without dissection.  There is some left-sided atelectasis.  Question whether this is from splinting.  Considered PE but patient's symptoms are intermittent nonpleuritic he is not hypoxic or tachycardic think this is less likely.  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  Radiology recommends yearly screening which I discussed with him.      FINAL CLINICAL IMPRESSION(S) / ED DIAGNOSES   Final diagnoses:  Chest pain, unspecified type     Rx / DC Orders   ED Discharge Orders     None        Note:  This document was prepared using Dragon voice recognition software and may include unintentional dictation errors.   Rada Hay, MD 02/10/22 1910

## 2023-01-09 ENCOUNTER — Emergency Department
Admission: EM | Admit: 2023-01-09 | Discharge: 2023-01-09 | Disposition: A | Discharge status: Home / Self Care | Payer: Self-pay | Attending: Emergency Medicine | Admitting: Emergency Medicine

## 2023-01-09 ENCOUNTER — Other Ambulatory Visit: Payer: Self-pay

## 2023-01-09 DIAGNOSIS — Z20822 Contact with and (suspected) exposure to covid-19: Secondary | ICD-10-CM | POA: Insufficient documentation

## 2023-01-09 DIAGNOSIS — R059 Cough, unspecified: Secondary | ICD-10-CM | POA: Insufficient documentation

## 2023-01-09 DIAGNOSIS — J029 Acute pharyngitis, unspecified: Secondary | ICD-10-CM | POA: Insufficient documentation

## 2023-01-09 LAB — RESP PANEL BY RT-PCR (RSV, FLU A&B, COVID)  RVPGX2
Influenza A by PCR: NEGATIVE
Influenza B by PCR: NEGATIVE
Resp Syncytial Virus by PCR: NEGATIVE
SARS Coronavirus 2 by RT PCR: NEGATIVE

## 2023-01-09 LAB — GROUP A STREP BY PCR: Group A Strep by PCR: NOT DETECTED

## 2023-01-09 MED ORDER — AZITHROMYCIN 250 MG PO TABS: ORAL_TABLET | ORAL | 0 refills | Status: AC

## 2023-01-09 NOTE — ED Triage Notes (Addendum)
Pt comes with sore throat and cough for about 10 days. Pt states dark green sputum that is thick. Pt states drainage in both eyes especially when he wakes up

## 2023-01-09 NOTE — ED Provider Notes (Signed)
   [facility] Provider Note    Event Date/Time   First MD Initiated Contact with Patient 01/09/23 1304     (approximate)   History   Cough and Sore Throat   HPI  Keith Roy is a 37 y.o. male who comes ED complaining of sore throat, productive cough, nasal congestion, gradual onset and worsening for the past 10 days.  Has some chills, no fever, no chest pain or shortness of breath.  Does have a history of aortic valve replacement, on anticoagulation.  Compliant with medications     Physical Exam   Triage Vital Signs: ED Triage Vitals  Encounter Vitals Group     BP 01/09/23 1144 (!) 126/95     Systolic BP Percentile --      Diastolic BP Percentile --      Pulse Rate 01/09/23 1143 95     Resp 01/09/23 1143 19     Temp 01/09/23 1143 98.7 F (37.1 C)     Temp src --      SpO2 01/09/23 1143 94 %     Weight 01/09/23 1144 235 lb (106.6 kg)     Height 01/09/23 1144 5\' 9"  (1.753 m)     Head Circumference --      Peak Flow --      Pain Score 01/09/23 1144 6     Pain Loc --      Pain Education --      Exclude from Growth Chart --     Most recent vital signs: Vitals:   01/09/23 1143 01/09/23 1144  BP:  (!) 126/95  Pulse: 95   Resp: 19   Temp: 98.7 F (37.1 C)   SpO2: 94%     General: Awake, no distress.  CV:  Good peripheral perfusion.  Regular rate and rhythm Resp:  Normal effort.  Clear to auscultation bilaterally Abd:  No distention.  Other:  No lower extremity edema.  Moist oral mucosa.  Nontoxic.  There is mild pharyngeal erythema without tonsillar swelling   ED Results / Procedures / Treatments   Labs (all labs ordered are listed, but only abnormal results are displayed) Labs Reviewed  RESP PANEL BY RT-PCR (RSV, FLU A&B, COVID)  RVPGX2  GROUP A STREP BY PCR     RADIOLOGY    PROCEDURES:  Procedures   MEDICATIONS ORDERED IN ED: Medications - No data to display   IMPRESSION / MDM / ASSESSMENT AND PLAN / ED COURSE  I  reviewed the triage vital signs and the nursing notes.                              Differential diagnosis includes, but is not limited to, COVID, influenza, RSV, group A strep, sinusitis, atypical pneumonia       FINAL CLINICAL IMPRESSION(S) / ED DIAGNOSES   Final diagnoses:  Acute pharyngitis, unspecified etiology     Rx / DC Orders   ED Discharge Orders          Ordered    azithromycin (ZITHROMAX Z-PAK) 250 MG tablet        01/09/23 1340             Note:  This document was prepared using Dragon voice recognition software and may include unintentional dictation errors.   Sharman Cheek, MD 01/09/23 1444

## 2023-06-23 ENCOUNTER — Encounter: Payer: Self-pay | Admitting: Emergency Medicine

## 2023-06-23 ENCOUNTER — Emergency Department

## 2023-06-23 ENCOUNTER — Emergency Department
Admission: EM | Admit: 2023-06-23 | Discharge: 2023-06-23 | Disposition: A | Discharge status: Home / Self Care | Attending: Emergency Medicine | Admitting: Emergency Medicine

## 2023-06-23 ENCOUNTER — Other Ambulatory Visit: Payer: Self-pay

## 2023-06-23 DIAGNOSIS — M79631 Pain in right forearm: Secondary | ICD-10-CM | POA: Insufficient documentation

## 2023-06-23 DIAGNOSIS — X501XXA Overexertion from prolonged static or awkward postures, initial encounter: Secondary | ICD-10-CM | POA: Insufficient documentation

## 2023-06-23 DIAGNOSIS — S46211A Strain of muscle, fascia and tendon of other parts of biceps, right arm, initial encounter: Secondary | ICD-10-CM | POA: Diagnosis not present

## 2023-06-23 DIAGNOSIS — M79621 Pain in right upper arm: Secondary | ICD-10-CM | POA: Diagnosis present

## 2023-06-23 DIAGNOSIS — Y93F2 Activity, caregiving, lifting: Secondary | ICD-10-CM | POA: Insufficient documentation

## 2023-06-23 MED ORDER — CYCLOBENZAPRINE HCL 5 MG PO TABS: 5.0000 mg | ORAL_TABLET | Freq: Three times a day (TID) | ORAL | 0 refills | Status: AC | PRN

## 2023-06-23 NOTE — ED Triage Notes (Signed)
 Pt presents to the ER from home reports he was moving some wood and was heavy, reports he felt a "pop" in his biceps and reports it is contracted. Pt talks in complete sentences no respiratory distress noted

## 2023-06-23 NOTE — ED Notes (Signed)
 Pt declined pain medication at this time. Pt drove himself to hospital per pt report.

## 2023-06-23 NOTE — Discharge Instructions (Signed)
 Apply ice and wear the sling as needed for support. Take the muscle relaxant as needed for pain and spasms. Follow-up with [facility] next week for surgical evaluation.

## 2023-06-23 NOTE — ED Notes (Signed)
 Pt states he was moving plywood and went to go throw same when he heard his right bicep "crunch and pop" and then felt it retract upwards.  The pt states he has had this happen on his left bicep twice and has had surgery for same. States the pain feel the same as last time.  Does state he takes warfin.  Pt states he does not want pain meds at this time.

## 2023-06-23 NOTE — ED Notes (Signed)
 Pt back from MRI

## 2023-06-23 NOTE — ED Provider Notes (Signed)
 [facility] Emergency Department Provider Note     Event Date/Time   First MD Initiated Contact with Patient 06/23/23 2104     (approximate)   History   Arm Pain   HPI  Keith Roy is a 38 y.o.Right-handed male presents to the ED for evaluation of acute pain to the distal elbow at the antecubital region.  He reports an acute injury when he was lifting a large piece of wood and the wood slipped and his hand creating a pop sensation to the medial elbow.  Since that time he said pain at the distal bicep, as well as deformity to the bicep muscle region.  Patient reports previous injury to the left elbow several years ago requiring surgical intervention.  He denies any other injury at this time.  Physical Exam   Triage Vital Signs: ED Triage Vitals [06/23/23 1857]  Encounter Vitals Group     BP (!) 136/110     Systolic BP Percentile      Diastolic BP Percentile      Pulse Rate 79     Resp 16     Temp 98.8 F (37.1 C)     Temp Source Oral     SpO2 95 %     Weight 230 lb (104.3 kg)     Height 5' 9 (1.753 m)     Head Circumference      Peak Flow      Pain Score 10     Pain Loc      Pain Education      Exclude from Growth Chart     Most recent vital signs: Vitals:   06/23/23 1857 06/23/23 2330  BP: (!) 136/110 (!) 152/100  Pulse: 79 83  Resp: 16 18  Temp: 98.8 F (37.1 C)   SpO2: 95% 96%    General Awake, no distress. NAD HEENT NCAT. PERRL. EOMI. No rhinorrhea. Mucous membranes are moist.  CV:  Good peripheral perfusion. No CCE distally RESP:  Normal effort.  MSK:  Right distal forearm with tenderness palpation to the antecubital region.  Patient with subtle deformity to the bicep musculature.  Tender to palpation to the antecubital noted on exam.  Decreased supination range against resistance.  Normal composite fist distally.   ED Results / Procedures / Treatments   Labs (all labs ordered are listed, but only abnormal results are  displayed) Labs Reviewed - No data to display   EKG   RADIOLOGY   personally viewed and evaluated these images as part of my medical decision making, as well as reviewing the written report by the radiologist.  ED Provider Interpretation: Acute right distal biceps tendon rupture  MRI Right Elbow w/o CM  Complete retracted rupture of the distal biceps tendon. 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  PROCEDURES:  Critical Care performed: No  Procedures   MEDICATIONS ORDERED IN ED: Medications - No data to display   IMPRESSION / MDM / ASSESSMENT AND PLAN / ED COURSE  I reviewed the triage vital signs and the nursing notes.                              Differential diagnosis includes, but is not limited to, elbow contusion, elbow strain, tendinitis, biceps tendon rupture, bursitis, muscle strain, myalgia  Patient's presentation is most consistent with acute complicated illness / injury requiring diagnostic  workup.  I consulted with Dr. Lorri Rota (Ortho) via secure chat.  He reviewed the case and recommended MRI imaging to confirm soft tissue injury.  He will see the patient in the office for surgical intervention and evaluation for next week.  Patient's diagnosis is consistent with acute distal biceps tendon rupture on the right.  Patient presents with a clinical mechanism concerning for acute rupture.  Rupture morphology is confirmed by patient's exam, and MRI.  Images reviewed and interpreted by me, confirm distal biceps tendon rupture.  Patient will be placed in an arm sling for comfort and support.  Patient will be discharged home with instructions to take OTC Tylenol  or Motrin as needed. Patient is to follow up with orthopedics as discussed, as needed or otherwise directed. Patient is given ED precautions to return to the ED for any worsening or new symptoms.     FINAL CLINICAL  IMPRESSION(S) / ED DIAGNOSES   Final diagnoses:  Rupture of right distal biceps tendon, initial encounter     Rx / DC Orders   ED Discharge Orders          Ordered    cyclobenzaprine  (FLEXERIL ) 5 MG tablet  3 times daily PRN        06/23/23 2321             Note:  This document was prepared using Dragon voice recognition software and may include unintentional dictation errors.    May Sparks, PA-C 06/27/23 1845    Marylynn Soho, MD 07/09/23 1501

## 2023-11-25 IMAGING — CT CT HEAD W/O CM
4 series · 16 of 47 positions shown, 18 images · non-contrast
Comparison: None Available.

CLINICAL DATA: Trauma



[Series 2: head wo · axial · 0.44mm/px · z∈[-72,+48]mm · 7 of 34 slices shown, 9 images]
[im 5/34  brain]
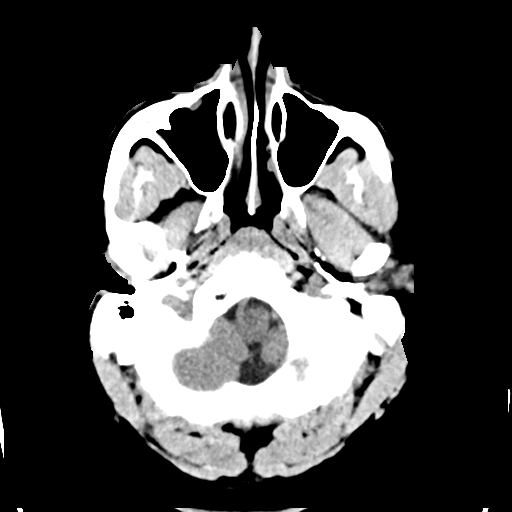
[im 5/34  bone]
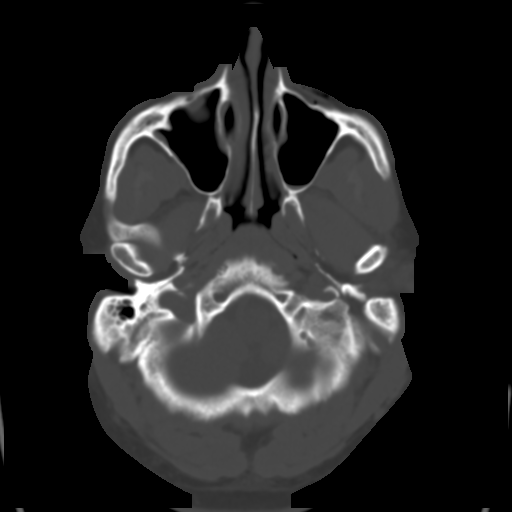
[im 9/34  brain]
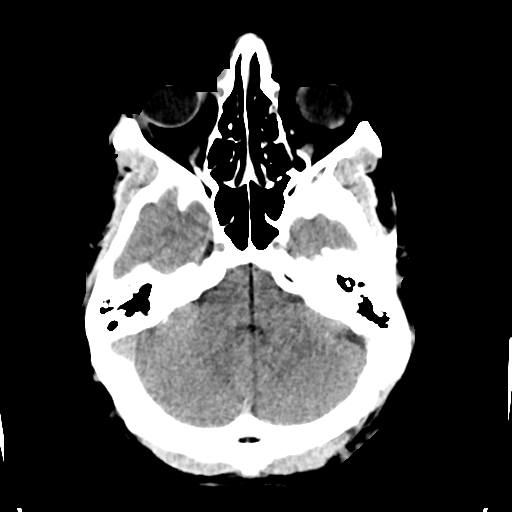
[im 13/34  brain]
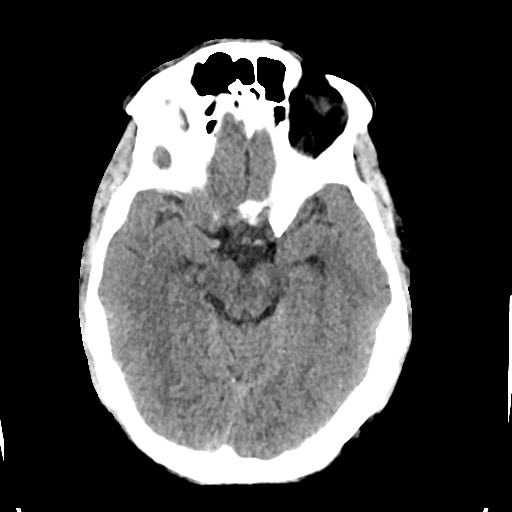
[im 17/34  brain]
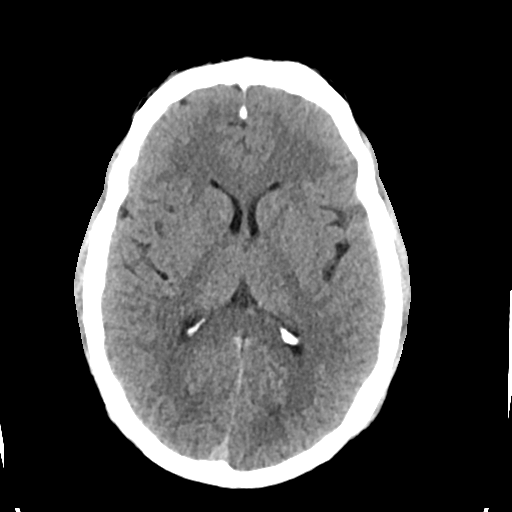
[im 21/34  brain]
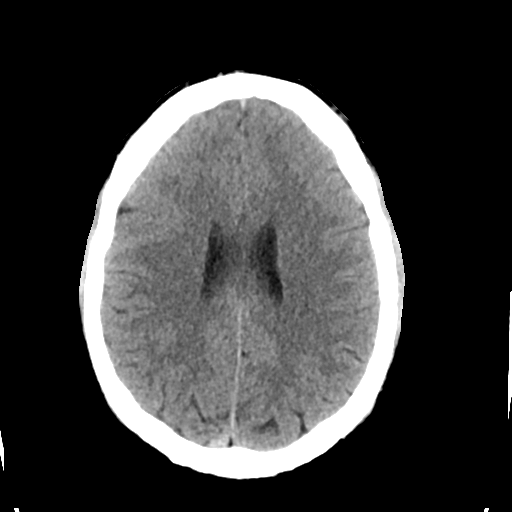
[im 21/34  bone]
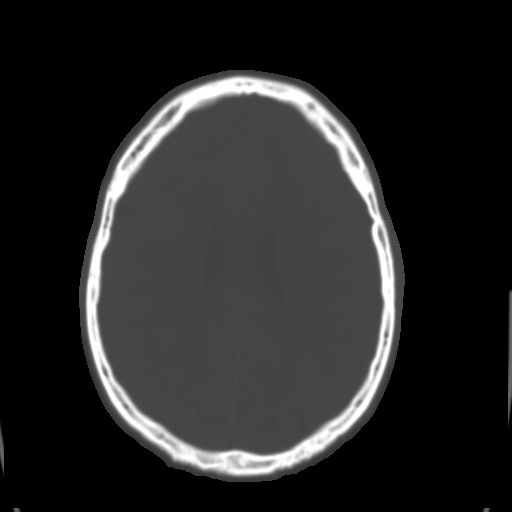
[im 25/34  brain]
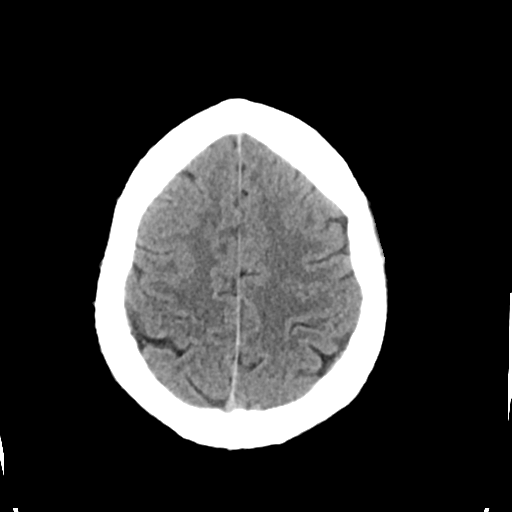
[im 29/34  brain]
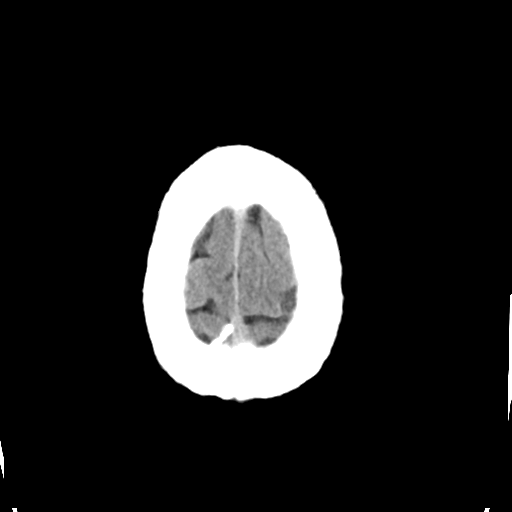

[Series 3: head bone · axial · 0.44mm/px · z∈[-76,-42]mm · 3 of 85 slices shown]
[im 9/85  bone]
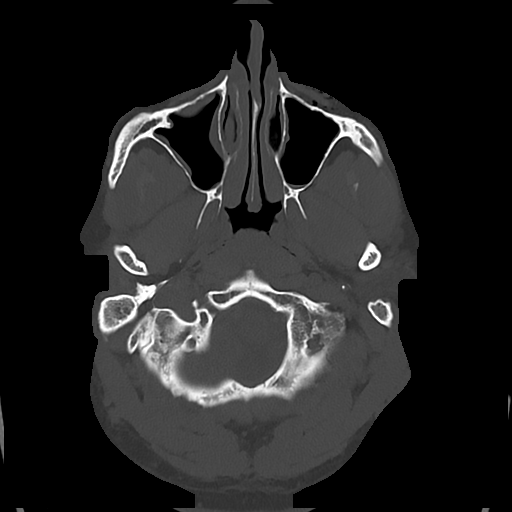
[im 17/85  bone]
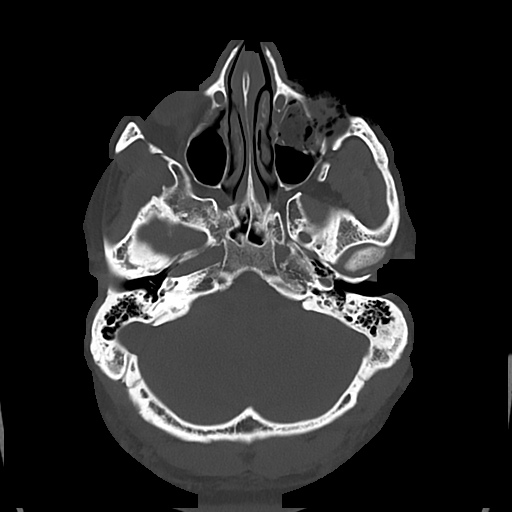
[im 26/85  bone]
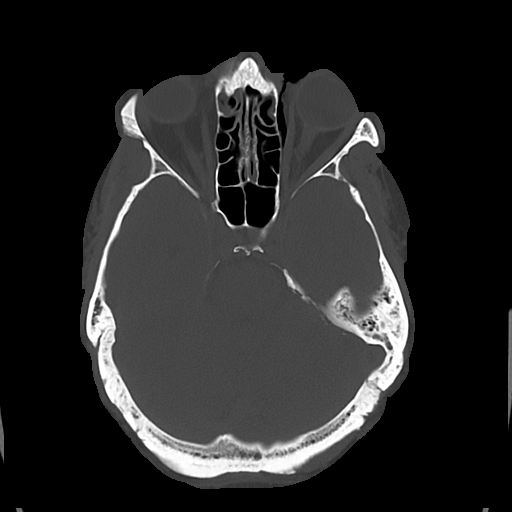

[Series 4: cor soft · coronal · 0.37mm/px · 3 of 77 slices shown]
[im 26/77  brain]
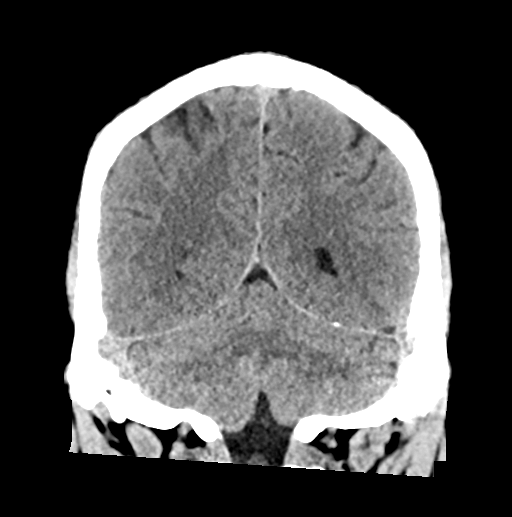
[im 34/77  brain]
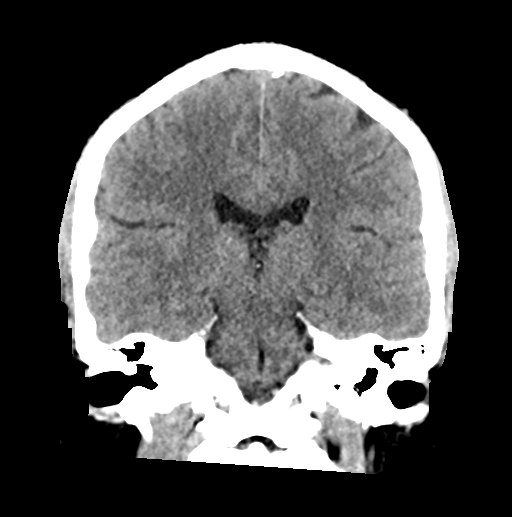
[im 43/77  brain]
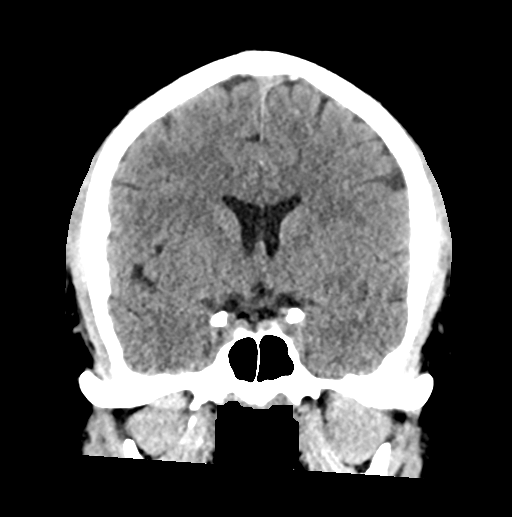

[Series 5: sag soft · sagittal · 0.35mm/px · 3 of 63 slices shown]
[im 21/63  brain]
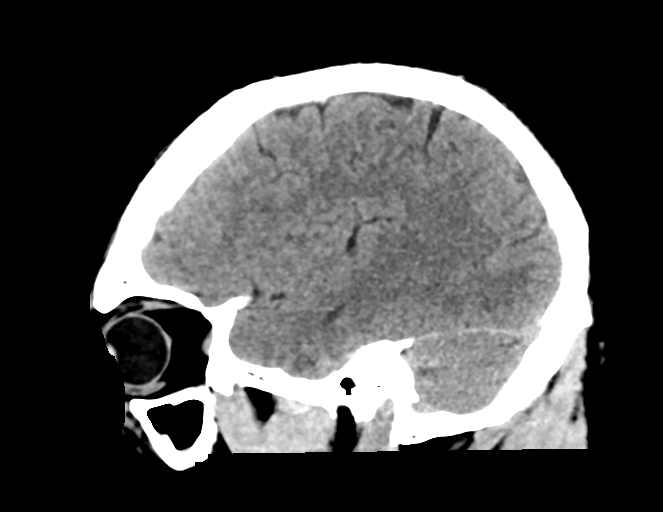
[im 32/63  brain]
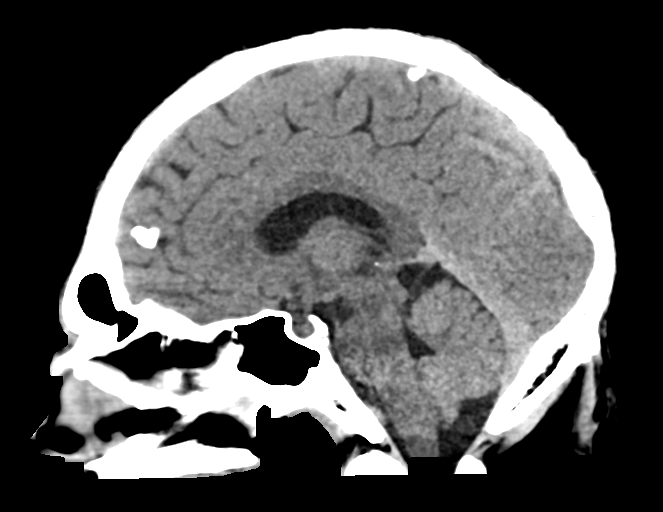
[im 42/63  brain]
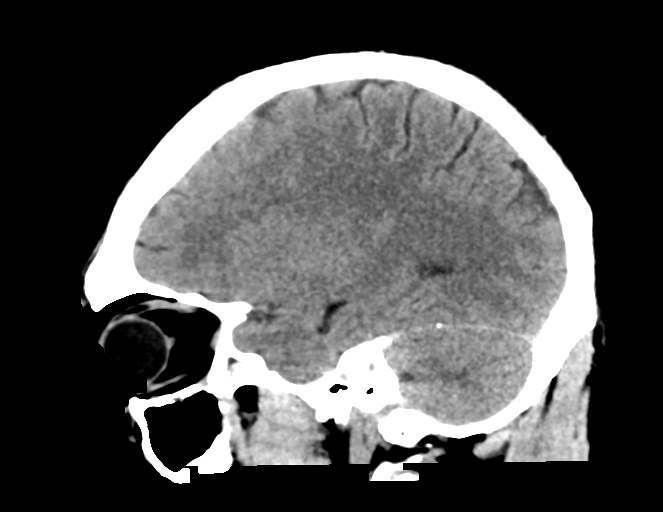

[16 of 47 positions shown; findings below may reference images not displayed]

FINDINGS: Brain: No acute intracranial findings are seen in noncontrast CT
brain. There are no signs of bleeding. Ventricles are not dilated.

Vascular: Unremarkable.

Skull: No fracture is seen in the calvarium.

Sinuses/Orbits: Small air-fluid level is seen in the left maxillary
sinus. There is mucosal thickening in the ethmoid, sphenoid and
maxillary sinuses. There is possible blowout fracture in the floor
of left orbit. Orbits will be better evaluated in the images of the
facial bones.

Other: None.
IMPRESSION: No acute intracranial findings are seen in noncontrast CT brain.

Chronic sinusitis. Blowout fracture is seen in the floor of left
orbit.

## 2023-11-25 IMAGING — CT CT CERVICAL SPINE W/O CM
3 of 4 series · 13 of 33 positions shown, 16 images · non-contrast
Comparison: None Available.

CLINICAL DATA: Trauma



[Series 7: sag bone · sagittal · 0.38mm/px · 5 of 125 slices shown, 6 images]
[im 42/125  bone]
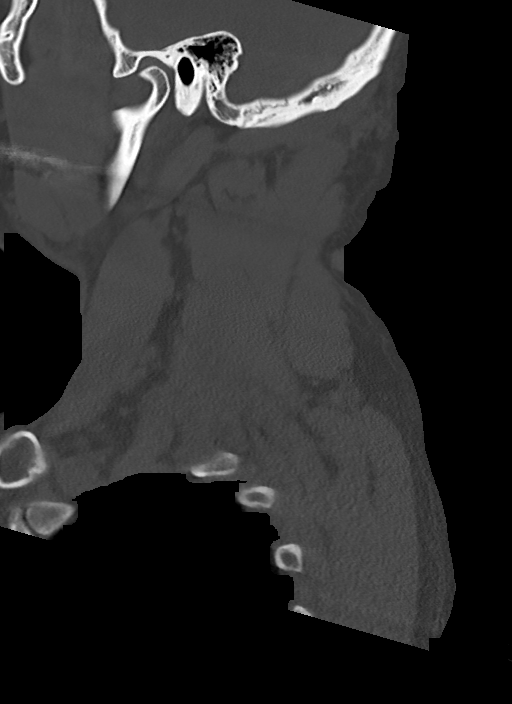
[im 52/125  bone]
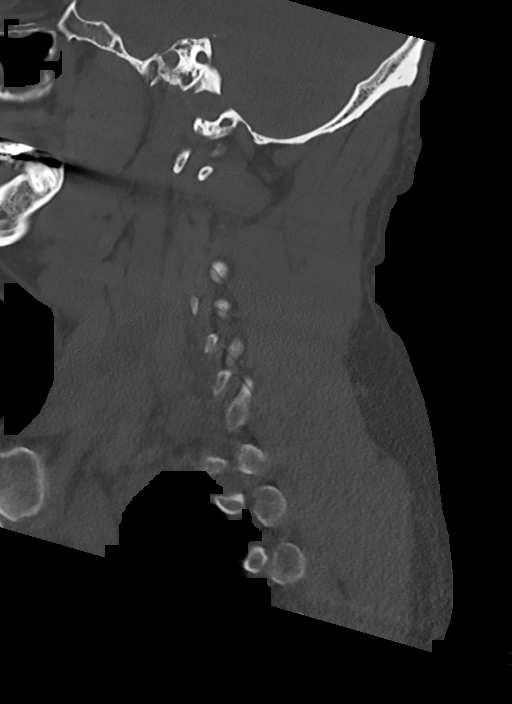
[im 63/125  soft-tissue]
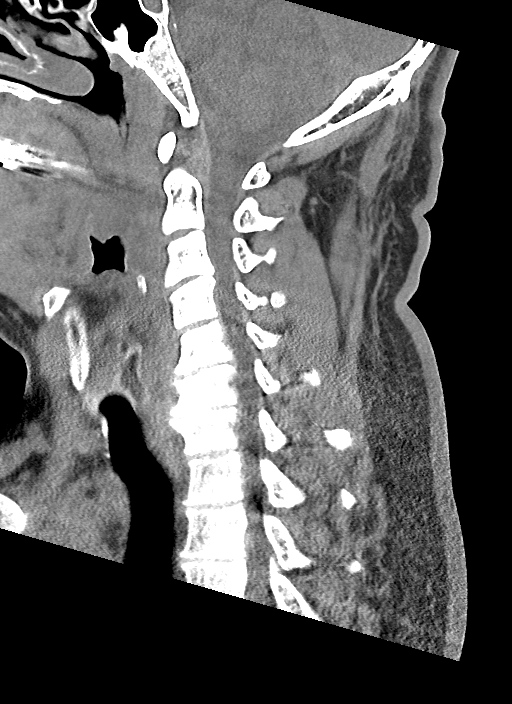
[im 63/125  bone]
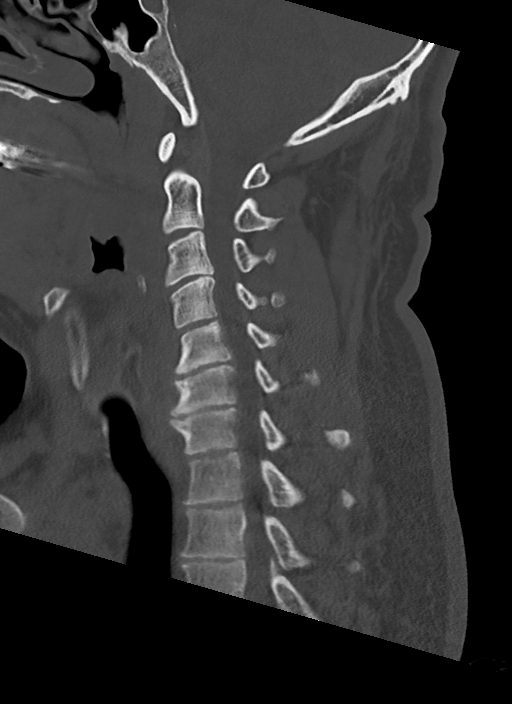
[im 73/125  bone]
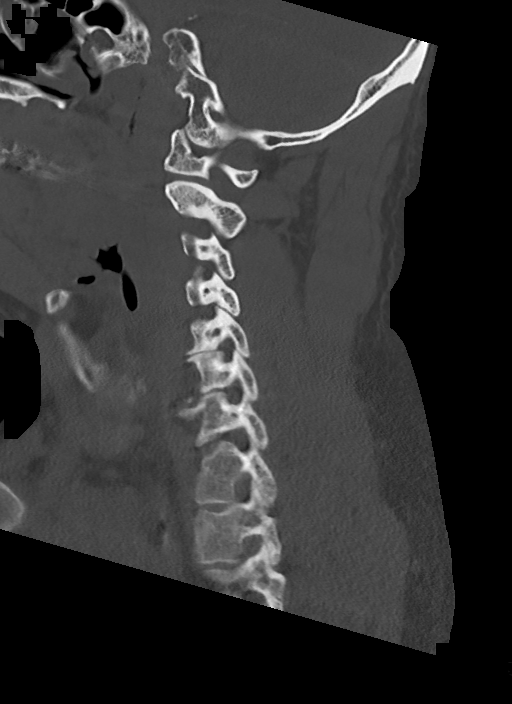
[im 83/125  bone]
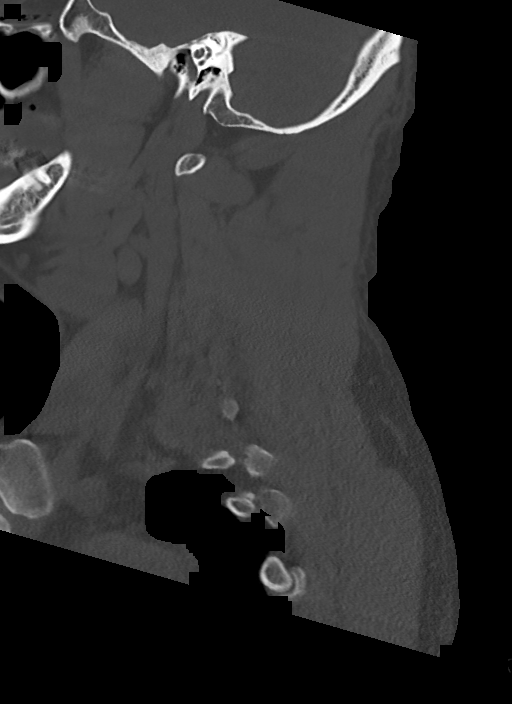

[Series 8: cor bone · coronal · 0.52mm/px · 3 of 141 slices shown]
[im 29/141  bone]
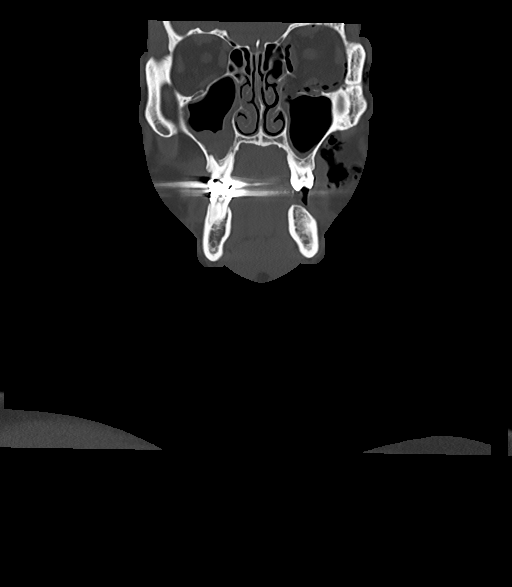
[im 57/141  bone]
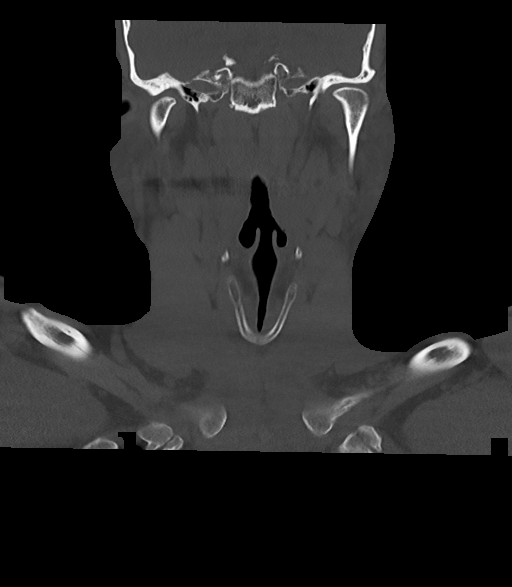
[im 85/141  bone]
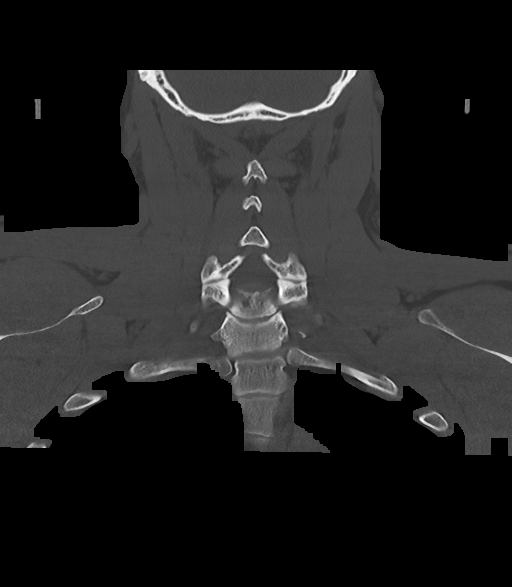

[Series 9: orthogonal axials · axial · 0.32mm/px · z∈[-237,-130]mm · 5 of 98 slices shown, 7 images]
[im 17/98  soft-tissue]
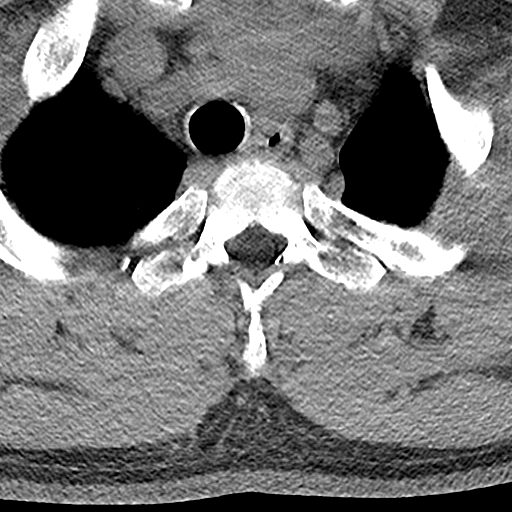
[im 17/98  bone]
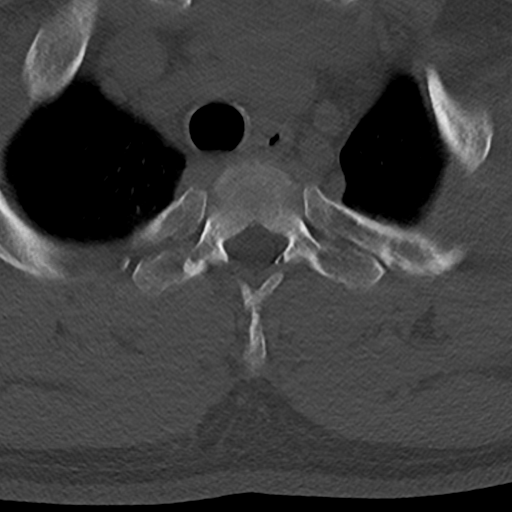
[im 33/98  bone]
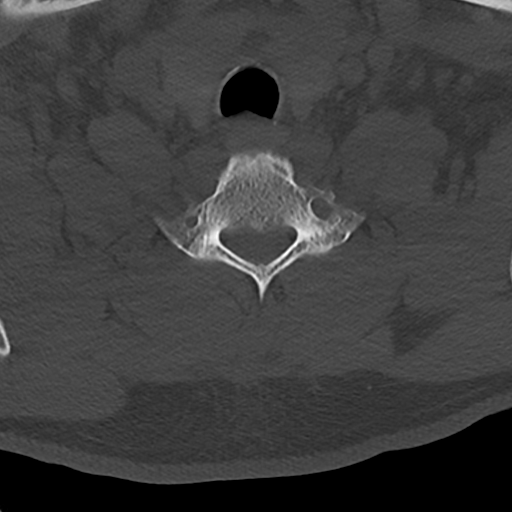
[im 49/98  bone]
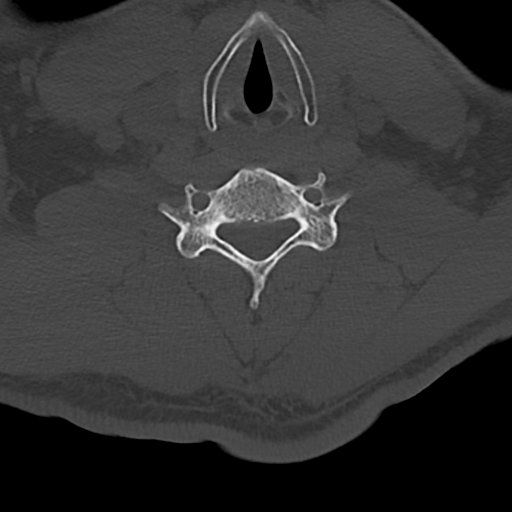
[im 65/98  bone]
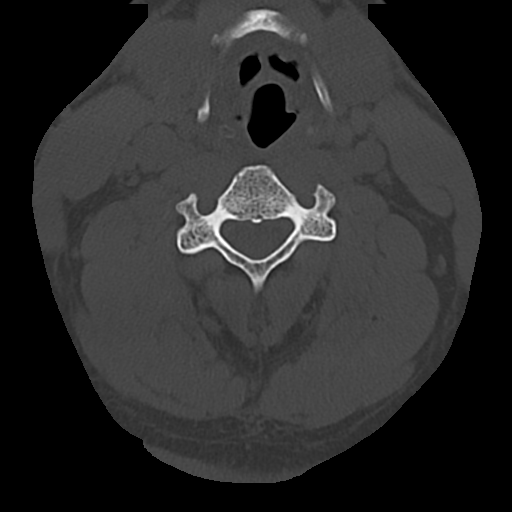
[im 81/98  soft-tissue]
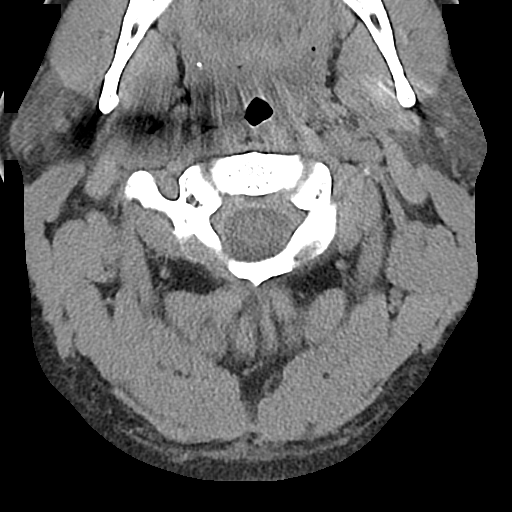
[im 81/98  bone]
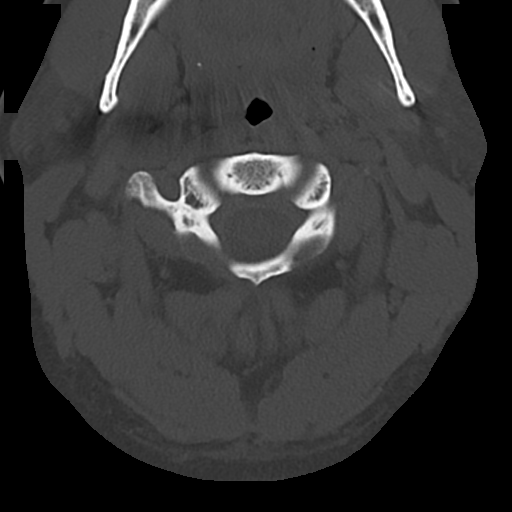

[13 of 33 positions shown; findings below may reference images not displayed]

FINDINGS: Alignment: Alignment of posterior margins of vertebral bodies is
unremarkable.

Skull base and vertebrae: No recent fracture is seen. Degenerative
changes are noted with bony spurs from C3 to C7 levels. There is
calcification in the posterior margin of disc space at C2-C3 level.

Soft tissues and spinal canal: There is extrinsic pressure over the
ventral margin of thecal sac caused by posterior bony spurs at C5-C6
and C6-C7 levels.

Disc levels: There is encroachment of neural foramina from C3-C7
levels.

Upper chest: Unremarkable.

Other: None.
IMPRESSION: No recent fracture is seen in the cervical spine. Cervical
spondylosis with encroachment of neural foramina from C3-C7 levels.
